# Patient Record
Sex: Female | Born: 1960 | Race: White | Hispanic: No | Smoking: Never smoker
Health system: Southern US, Community
[De-identification: ages and names within clinical notes are randomized; demographics above are authoritative.]

## PROBLEM LIST (undated history)

## (undated) DIAGNOSIS — Z923 Personal history of irradiation: Secondary | ICD-10-CM

## (undated) DIAGNOSIS — Z9221 Personal history of antineoplastic chemotherapy: Secondary | ICD-10-CM

## (undated) DIAGNOSIS — C4490 Unspecified malignant neoplasm of skin, unspecified: Secondary | ICD-10-CM

## (undated) DIAGNOSIS — C50919 Malignant neoplasm of unspecified site of unspecified female breast: Secondary | ICD-10-CM

## (undated) DIAGNOSIS — E785 Hyperlipidemia, unspecified: Secondary | ICD-10-CM

## (undated) HISTORY — PX: POLYPECTOMY: SHX149

## (undated) HISTORY — PX: BUNIONECTOMY: SHX129

## (undated) HISTORY — DX: Unspecified malignant neoplasm of skin, unspecified: C44.90

## (undated) HISTORY — DX: Hyperlipidemia, unspecified: E78.5

## (undated) HISTORY — DX: Malignant neoplasm of unspecified site of unspecified female breast: C50.919

---

## 2008-07-28 HISTORY — PX: BREAST LUMPECTOMY: SHX2

## 2012-04-13 ENCOUNTER — Telehealth: Payer: Self-pay | Admitting: *Deleted

## 2012-04-13 ENCOUNTER — Encounter: Payer: Self-pay | Admitting: Family

## 2012-04-13 ENCOUNTER — Ambulatory Visit (HOSPITAL_BASED_OUTPATIENT_CLINIC_OR_DEPARTMENT_OTHER): Payer: BC Managed Care – PPO | Admitting: Family

## 2012-04-13 ENCOUNTER — Ambulatory Visit: Payer: BC Managed Care – PPO

## 2012-04-13 ENCOUNTER — Telehealth: Payer: Self-pay | Admitting: Oncology

## 2012-04-13 ENCOUNTER — Other Ambulatory Visit (HOSPITAL_BASED_OUTPATIENT_CLINIC_OR_DEPARTMENT_OTHER): Payer: BC Managed Care – PPO

## 2012-04-13 ENCOUNTER — Other Ambulatory Visit: Payer: Self-pay | Admitting: *Deleted

## 2012-04-13 VITALS — BP 116/75 | HR 73 | Temp 98.3°F | Resp 20 | Ht 65.5 in | Wt 164.6 lb

## 2012-04-13 DIAGNOSIS — C50419 Malignant neoplasm of upper-outer quadrant of unspecified female breast: Secondary | ICD-10-CM

## 2012-04-13 DIAGNOSIS — C50919 Malignant neoplasm of unspecified site of unspecified female breast: Secondary | ICD-10-CM

## 2012-04-13 DIAGNOSIS — K7689 Other specified diseases of liver: Secondary | ICD-10-CM

## 2012-04-13 DIAGNOSIS — R911 Solitary pulmonary nodule: Secondary | ICD-10-CM

## 2012-04-13 DIAGNOSIS — Z853 Personal history of malignant neoplasm of breast: Secondary | ICD-10-CM

## 2012-04-13 DIAGNOSIS — Z170 Estrogen receptor positive status [ER+]: Secondary | ICD-10-CM

## 2012-04-13 LAB — CBC WITH DIFFERENTIAL/PLATELET
BASO%: 1.1 % (ref 0.0–2.0)
Eosinophils Absolute: 0.1 10*3/uL (ref 0.0–0.5)
LYMPH%: 32.5 % (ref 14.0–49.7)
MCHC: 33.8 g/dL (ref 31.5–36.0)
MONO#: 0.4 10*3/uL (ref 0.1–0.9)
NEUT#: 2 10*3/uL (ref 1.5–6.5)
Platelets: 239 10*3/uL (ref 145–400)
RBC: 3.85 10*6/uL (ref 3.70–5.45)
RDW: 12.9 % (ref 11.2–14.5)
WBC: 3.8 10*3/uL — ABNORMAL LOW (ref 3.9–10.3)
lymph#: 1.2 10*3/uL (ref 0.9–3.3)

## 2012-04-13 LAB — COMPREHENSIVE METABOLIC PANEL (CC13)
ALT: 59 U/L — ABNORMAL HIGH (ref 0–55)
Albumin: 4.2 g/dL (ref 3.5–5.0)
CO2: 27 mEq/L (ref 22–29)
Chloride: 104 mEq/L (ref 98–107)
Glucose: 165 mg/dl — ABNORMAL HIGH (ref 70–99)
Potassium: 3.8 mEq/L (ref 3.5–5.1)
Sodium: 140 mEq/L (ref 136–145)
Total Bilirubin: 0.5 mg/dL (ref 0.20–1.20)
Total Protein: 6.9 g/dL (ref 6.4–8.3)

## 2012-04-13 LAB — CANCER ANTIGEN 27.29: CA 27.29: 22 U/mL (ref 0–39)

## 2012-04-13 NOTE — Telephone Encounter (Signed)
Confirmed appt for today w/ pt.  Alexa Kelley & Dr. Donnie Coffin a chart.

## 2012-04-13 NOTE — Progress Notes (Signed)
[facility] NEW PATIENT EVALUATION  Name: Alexa Kelley                  DATE: 04/13/2012 MRN: [id number]                      DOB: 22-Dec-1960  CC: Cain Saupe, MD          REASON FOR VISIT: Establish care for surveillance, history left breast cancer.   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 16, 2010, tumor measuring 1.0 cm in greatest dimension, ER/PR+, HER 2 negative. Had left partial mastectomy with sentinel node biopsy Aug. 21, 2010, residual tumor measuring 0.4 cm  With no evidence of lymphovascular invasion, margins free with 1 of 3 sentinel nodes positive for micrometastasis (0.65mm), with level 1 & 2 axillary node dissection with no additional tumor in 16 nodes.   PRIOR THERAPY: Received 4 cycles of dose dense AC, then enrolled in the ECOG 5103 study, to receive paclitaxel plus bevacizamab vs placebo every 3 weeks for 12 weeks. Was unblinded after 3 cycles, was receiving placebo, and then completed with taxol only. Was placed on Tamoxifen May 2011, switched to Letrozole 10/05/10.   CURRENT THERAPY:  Letrozole 2.5 mg daily.   CANCER STAGE: Stage IIA, T1b, N9mi+ M0  PAST MEDICAL HISTORY:  Breast cancer and skin cancer.  PAST SURGICAL HISTORY:  Past Surgical History  Procedure Date  . Breast lumpectomy 2010    left breast     FAMILY HISTORY: Mother was diagnosed with and treated for breast cancer at age 83. Died from other causes without evidence of breast cancer recurrence. Mother's cousin diagnosed and treated for breast cancer at about the same age, alive and well. No other familial history of malignancies. She has 2 daughters and one son. One daughter has Alagille syndrome, a genetic disorder that affects the liver, heart, kidney and other body systems. Problems associated with the disorder generally become evident in infancy or early  childhood. The disorder is inherited in an autosomal dominant pattern, and the estimated prevalence of Alagille syndrome is 1 in every 100,000 live births.   ALLERGIES: NKDA  CURRENT MEDICATIONS:  Letrozole, fenofibrate, prilosec, Vit D and calcium.   SOCIAL HISTORY:  reports that she has never smoked. She has never used smokeless tobacco. She reports that she drinks about .6 ounces of alcohol per week. She reports that she does not use illicit drugs.  GYN HISTORY: G3P3.   REVIEW OF SYSTEMS: General: Negative for fever, chills, night sweats,  loss of appetite or weight loss.Occasional hot flash.  HEENT: Negative for headaches, sore  throat, difficulty swallowing, blurred vision or problem with hearing or  sinus congestion. Respiratory: Negative for shortness of breath, cough  or dyspnea on exertion. Cardiovascular: Negative for chest pain,  palpitations or pedal edema. GI: Negative for nausea, vomiting,  diarrhea, constipation, change in bowel habits or blood in the stool.  No jaundice. GU: Negative for painful or frequent urination, change in  color of urine, or decreased urinary stream. Integumentary: Negative  for skin rashes or other suspicious skin lesions. Hematologic: Negative  for easy bruisability or bleeding. Musculoskeletal: Negative for  complaints of pain, arthralgias, arthritis or myalgias. Mild left upper extremity Lymphedema, wears compression sleeve.  Neurological/psychiatric: Negative for numbness, focal weakness,  balance problems or coordination difficulties. No depression or anxiety.  Breast: No self-detected breast complaints.   PERFORMANCE STATUS: ECOG 0  PSYCHOSOCIAL: Married, has 2 children in college and one at home. Husband works at [employer], was just diagnosed with ALS.  PHYSICAL EXAM: General: Well developed, well nourished, in no acute distress.  EENT: No ocular or oral lesions. No stomatitis.  Respiratory: Lungs are clear to auscultation bilaterally with  normal respiratory movement and no accessory muscle use. Cardiac: No murmur, rub or tachycardia. No upper or lower extremity edema.  GI: Abdomen is soft, no palpable hepatosplenomegaly. No fluid wave. No tenderness. Musculoskeletal: No kyphosis, no tenderness over the spine, ribs or hips. Compression sleeve in place, left arm. No noticeable lymphedema.  Lymph: No cervical, infraclavicular, axillary or inguinal adenopathy. Neuro: No focal neurological deficits. Psych: Alert and oriented X 3, appropriate mood and affect.  BREAST EXAM: In the supine position, with the right arm over the head, the right nipple is everted. No periareolar edema or nipple discharge. No mass in any quadrant or subareolar region. No redness of the skin. No right axillary adenopathy. Port incision scar, right chest.  With the left arm over the head, the left nipple is everted. No periareolar edema or nipple discharge. No mass in any quadrant or subareolar region. From the 11:30 o'clock position - 1:30 position, a well healed remote lumpectomy scar. No redness of the skin. No left axillary adenopathy. Well healed incision.   LABORATORY DATA:  Lab Results  Component Value Date   WBC 3.8* 04/13/2012   HGB 11.8 04/13/2012   HCT 34.9 04/13/2012   MCV 90.5 04/13/2012   PLT 239 04/13/2012  Vitamin D pending.   RADIOGRAPHIC STUDIES: Last bone density Dec. 2012.  Mammogram is due now.   IMPRESSION:51 y/o with: 1. History left breast cancer, 2010, completed chemo in Feb. 2011. No evidence of recurrence clinically 2. On Letrozole with good tolerance.  3. Due mammogram.  4. Lymphedema, left arm. Wears sleeve with good control.  5. Vaginal dryness on antiestrogen.   6. BRCA negative, testing done in [place].  7. Pulmonary nodule and liver lesion, stable on subsequent imaging.   PLAN:    1. Mammogram 2. Remain on Letrozole. 3. Vitamin E vaginally twice weekly.  4. Return in 6 months to see Dr. Donnie Coffin per NCCN guidelines for  surveillance.   Dr Donnie Coffin met the patient and counseled her regarding surveillance plan. She knows to call the office if the need arises.

## 2012-04-13 NOTE — Telephone Encounter (Signed)
No pof sent - per pt no f/u required. Pt given appt for mammo @ [facility] 9/26.

## 2012-05-06 ENCOUNTER — Ambulatory Visit
Admission: RE | Admit: 2012-05-06 | Discharge: 2012-05-06 | Disposition: A | Discharge status: Home / Self Care | Payer: BC Managed Care – PPO | Source: Ambulatory Visit | Attending: Family | Admitting: Family

## 2012-05-06 DIAGNOSIS — Z853 Personal history of malignant neoplasm of breast: Secondary | ICD-10-CM

## 2012-06-21 ENCOUNTER — Other Ambulatory Visit: Payer: Self-pay | Admitting: *Deleted

## 2012-06-21 ENCOUNTER — Telehealth: Payer: Self-pay | Admitting: *Deleted

## 2012-06-21 DIAGNOSIS — Z853 Personal history of malignant neoplasm of breast: Secondary | ICD-10-CM

## 2012-06-21 DIAGNOSIS — C50919 Malignant neoplasm of unspecified site of unspecified female breast: Secondary | ICD-10-CM

## 2012-06-21 MED ORDER — LETROZOLE 2.5 MG PO TABS: 2.5000 mg | ORAL_TABLET | Freq: Every day | ORAL | Status: DC

## 2012-06-21 NOTE — Telephone Encounter (Signed)
Mailed out calendar to inform the patient of the new date and time in 2014 

## 2012-09-04 ENCOUNTER — Telehealth: Payer: Self-pay | Admitting: *Deleted

## 2012-09-04 NOTE — Telephone Encounter (Signed)
Left vm for pt to return call to r/s f/u appt. 

## 2012-09-06 ENCOUNTER — Telehealth: Payer: Self-pay | Admitting: *Deleted

## 2012-09-06 NOTE — Telephone Encounter (Signed)
Confirmed new appt date and time for 10/06/12 at 1000 with Warner Mccreedy, NP.

## 2012-09-11 ENCOUNTER — Encounter: Payer: Self-pay | Admitting: Oncology

## 2012-10-04 ENCOUNTER — Telehealth: Payer: Self-pay | Admitting: Oncology

## 2012-10-04 NOTE — Telephone Encounter (Signed)
Pt called and wants to r/s appt for 3/12 to April 2014 , she wants to draw labs a few days before MD she is a former Dr. Donnie Coffin pt and now Dr. Welton Flakes

## 2012-10-06 ENCOUNTER — Ambulatory Visit: Payer: BC Managed Care – PPO | Admitting: Gynecologic Oncology

## 2012-10-08 ENCOUNTER — Encounter: Payer: Self-pay | Admitting: Oncology

## 2012-10-12 ENCOUNTER — Other Ambulatory Visit: Payer: BC Managed Care – PPO | Admitting: Lab

## 2012-10-20 ENCOUNTER — Other Ambulatory Visit (HOSPITAL_BASED_OUTPATIENT_CLINIC_OR_DEPARTMENT_OTHER): Payer: BC Managed Care – PPO

## 2012-10-20 DIAGNOSIS — C50919 Malignant neoplasm of unspecified site of unspecified female breast: Secondary | ICD-10-CM

## 2012-10-20 DIAGNOSIS — Z853 Personal history of malignant neoplasm of breast: Secondary | ICD-10-CM

## 2012-10-20 LAB — LACTATE DEHYDROGENASE (CC13): LDH: 157 U/L (ref 125–245)

## 2012-10-20 LAB — COMPREHENSIVE METABOLIC PANEL (CC13)
Albumin: 4 g/dL (ref 3.5–5.0)
BUN: 20.3 mg/dL (ref 7.0–26.0)
Calcium: 9.8 mg/dL (ref 8.4–10.4)
Chloride: 107 mEq/L (ref 98–107)
Creatinine: 1 mg/dL (ref 0.6–1.1)
Glucose: 146 mg/dl — ABNORMAL HIGH (ref 70–99)
Potassium: 4.2 mEq/L (ref 3.5–5.1)

## 2012-10-20 LAB — CBC WITH DIFFERENTIAL/PLATELET
Basophils Absolute: 0.1 10*3/uL (ref 0.0–0.1)
HCT: 38.3 % (ref 34.8–46.6)
HGB: 13.1 g/dL (ref 11.6–15.9)
LYMPH%: 41.3 % (ref 14.0–49.7)
MCH: 30.8 pg (ref 25.1–34.0)
MONO#: 0.4 10*3/uL (ref 0.1–0.9)
NEUT%: 43.2 % (ref 38.4–76.8)
Platelets: 270 10*3/uL (ref 145–400)
WBC: 4.8 10*3/uL (ref 3.9–10.3)
lymph#: 2 10*3/uL (ref 0.9–3.3)

## 2012-10-21 ENCOUNTER — Ambulatory Visit: Payer: BC Managed Care – PPO | Admitting: Oncology

## 2012-10-27 ENCOUNTER — Ambulatory Visit: Payer: BC Managed Care – PPO | Admitting: Gynecologic Oncology

## 2012-10-29 ENCOUNTER — Encounter: Payer: Self-pay | Admitting: Family

## 2012-10-29 ENCOUNTER — Telehealth: Payer: Self-pay | Admitting: *Deleted

## 2012-10-29 ENCOUNTER — Ambulatory Visit (HOSPITAL_BASED_OUTPATIENT_CLINIC_OR_DEPARTMENT_OTHER): Payer: BC Managed Care – PPO | Admitting: Family

## 2012-10-29 VITALS — BP 130/77 | HR 61 | Temp 98.3°F | Resp 20 | Ht 65.0 in | Wt 163.5 lb

## 2012-10-29 DIAGNOSIS — F3289 Other specified depressive episodes: Secondary | ICD-10-CM

## 2012-10-29 DIAGNOSIS — Z853 Personal history of malignant neoplasm of breast: Secondary | ICD-10-CM

## 2012-10-29 DIAGNOSIS — F329 Major depressive disorder, single episode, unspecified: Secondary | ICD-10-CM

## 2012-10-29 DIAGNOSIS — C50919 Malignant neoplasm of unspecified site of unspecified female breast: Secondary | ICD-10-CM

## 2012-10-29 DIAGNOSIS — N959 Unspecified menopausal and perimenopausal disorder: Secondary | ICD-10-CM

## 2012-10-29 DIAGNOSIS — C50912 Malignant neoplasm of unspecified site of left female breast: Secondary | ICD-10-CM

## 2012-10-29 DIAGNOSIS — I890 Lymphedema, not elsewhere classified: Secondary | ICD-10-CM

## 2012-10-29 MED ORDER — LETROZOLE 2.5 MG PO TABS: 2.5000 mg | ORAL_TABLET | Freq: Every day | ORAL | Status: DC

## 2012-10-29 NOTE — Progress Notes (Signed)
[facility]  Telephone:([phone] Fax:([phone]) [phone]  OFFICE PROGRESS NOTE  PATIENT: Alexa Kelley   DOB: 1961-07-10  MR#: [id number]  VHQ#:[id number]   UX:LKGM, CAMMIE, MD   DIAGNOSIS:  A 52 year old [place], [place] woman with invasive ductal carcinoma of the left breast diagnosed in 02/2009.  The patient received the majority of her oncology care in [place] at [facility].   PRIOR THERAPY: 1.  Status post left breast excisional biopsy on 02/09/2009 which showed a ER positive, PR positive, HER-2/neu negative tumor measuring 1.0 cm.  2.  Status post left breast lumpectomy on 03/17/2009 with sentinel node biopsy, for a stage IIA, T1b N65mi+, invasive ductal carcinoma, residual tumor measured 0.4 cm, with no evidence of lymphovascular invasion, surgical margins were free of tumor, with 1/3 positive lymph nodes for micrometastasis (0.5 mm).  3.  Status post level I and level II left axillary lymph node dissection with no additional tumor in 16 resected lymph nodes.  4.  The patient was enrolled in ECOG 5103 study and is status post 4 cycles of chemotherapy with dose dense Doxorubicin/Cyclophosphamide (Adriamycin/Cytoxan) followed by 12 cycles of every 3 weeks Taxol/Avastin vs placebo.  The study was unblinded after 3 cycles and the patient was found to be in the placebo arm of the study that had only received Taxol.  She completed the study receiving Taxol every 3 weeks and completed therapy on 09/02/2009.  5.  The patient completed radiation therapy in 10/2009 or 11/2009.  6.  The patient underwent genetic testing and was found to be negative for the BRCA1 and BRCA2 gene mutation in 2011.  7.  The patient started antiestrogen therapy with Tamoxifen in 11/2009.  When she was found to have a postmenopausal estradiol and FSH levels, she was switched to Letrozole in 12/2010.  CURRENT THERAPY: Annual bilateral digital diagnostic mammography and antiestrogen therapy  with Letrozole 2.5 mg by mouth daily   INTERVAL HISTORY: Dr. Welton Flakes and I saw [name] today for follow up of invasive ductal carcinoma of the left breast.  She was last seen by Colman Cater on 04/13/2012.  Since her last office visit she has been experiencing left upper extremity lymphedema, urinary frequency, vaginal dryness, hot flashes and she is under a tremendous amount of personal stress with 2 ailing family members at home (her husband was recently diagnosed with ALS and she has a daughter with Alagille syndrome). The patient denies any other symptomatology.   PAST MEDICAL HISTORY: Past Medical History  Diagnosis Date  . Breast cancer   . Skin cancer   . Hyperlipidemia     PAST SURGICAL HISTORY: Past Surgical History  Procedure Laterality Date  . Breast lumpectomy  2010    left breast   . Polypectomy    . Bunionectomy Left     FAMILY HISTORY: Family History  Problem Relation Age of Onset  . Cancer Mother     Breast cancer  . Thrombosis Mother     Brain clot  . Cancer Cousin     Breast cancer  . Hypertension Father   . Cancer Brother     Basal cell carcinoma  . Other Daughter     Alagille syndrome  . ALS Other     Husband    SOCIAL HISTORY: History  Substance Use Topics  . Smoking status: Never Smoker   . Smokeless tobacco: Never Used  . Alcohol Use: 0.6 oz/week    1 Glasses of wine per week  ALLERGIES: No Known Allergies   MEDICATIONS:  Current Outpatient Prescriptions  Medication Sig Dispense Refill  . aspirin 81 MG chewable tablet Chew 81 mg by mouth daily.      . calcium carbonate (OS-CAL) 600 MG TABS Take 600 mg by mouth 2 (two) times daily with a meal.      . calcium-vitamin D 250-100 MG-UNIT per tablet Take 1 tablet by mouth 2 (two) times daily.      Marland Kitchen ezetimibe-simvastatin (VYTORIN) 10-40 MG per tablet Take 1 tablet by mouth at bedtime.      . fenofibrate (TRIGLIDE) 50 MG tablet Take 50 mg by mouth daily.      . fish oil-omega-3 fatty  acids 1000 MG capsule Take 2 g by mouth daily.      Marland Kitchen letrozole (FEMARA) 2.5 MG tablet Take 1 tablet (2.5 mg total) by mouth daily.  90 tablet  8  . Multiple Vitamins-Minerals (MULTIVITAMIN WITH MINERALS) tablet Take 1 tablet by mouth 2 (two) times a week.       No current facility-administered medications for this visit.      REVIEW OF SYSTEMS: A 10 point review of systems was completed and is negative except as noted above.    PHYSICAL EXAMINATION: BP 130/77  Pulse 61  Temp(Src) 98.3 F (36.8 C) (Oral)  Resp 20  Ht 5\' 5"  (1.651 m)  Wt 163 lb 8 oz (74.163 kg)  BMI 27.21 kg/m2   General appearance: Alert, cooperative, well nourished, mild distress Head: Normocephalic, without obvious abnormality, atraumatic Eyes: Conjunctivae/corneas clear, PERRLA, EOMI Nose: Nares, septum and mucosa are normal, no drainage or sinus tenderness Neck: No adenopathy, supple, symmetrical, trachea midline, thyroid not enlarged, no tenderness Resp: Clear to auscultation bilaterally Cardio: Regular rate and rhythm, S1, S2 normal, no murmur, click, rub or gallop Breasts: Soft bilaterally, left breast and axillary well-healed surgical scars, left axillary tenderness, left axillary fullness, left upper extremity lymphadenopathy -currently wearing a lymphedema sleeve, left breast radiation changes noted, right breast no nipple inversion, no axilla fullness GI: Soft, distended, non-tender, hypoactive bowel sounds, no organomegaly Skin: Several nevi on trunk area Extremities: Extremities normal, atraumatic, no cyanosis or edema Lymph nodes: Cervical, supraclavicular, and axillary nodes normal Neurologic: Grossly normal Psych: Visible anxiety   ECOG FS:  Grade 1 - Symptomatic but completely ambulatory        LAB RESULTS: Lab Results  Component Value Date   WBC 4.8 10/20/2012   NEUTROABS 2.1 10/20/2012   HGB 13.1 10/20/2012   HCT 38.3 10/20/2012   MCV 89.9 10/20/2012   PLT 270 10/20/2012      Chemistry       Component Value Date/Time   NA 142 10/20/2012 0859   K 4.2 10/20/2012 0859   CL 107 10/20/2012 0859   CO2 26 10/20/2012 0859   BUN 20.3 10/20/2012 0859   CREATININE 1.0 10/20/2012 0859      Component Value Date/Time   CALCIUM 9.8 10/20/2012 0859   ALKPHOS 52 10/20/2012 0859   AST 29 10/20/2012 0859   ALT 44 10/20/2012 0859   BILITOT 0.34 10/20/2012 0859      Lab Results  Component Value Date   LABCA2 20 10/20/2012     RADIOGRAPHIC STUDIES: No results found.  ASSESSMENT: 52 y.o. [place], [place] woman with: 1.Status post left breast excisional biopsy on 02/09/2009 which showed a ER positive, PR positive, HER-2/neu negative tumor measuring 1.0 cm.   Status post left breast lumpectomy on 03/17/2009 with sentinel node biopsy,  for a stage IIA, T1b N14mi+, invasive ductal carcinoma, residual tumor measured 0.4 cm, with no evidence of lymphovascular invasion, surgical margins were free of tumor, with 1/3 positive lymph nodes for micrometastasis (0.5 mm).  Status post chemotherapy as part of the  ECOG 5103 study which included 4 cycles of chemotherapy with dose dense Doxorubicin/cyclophosphamide (Adriamycin/Cytoxan) followed by 12 cycles of every 3 weeks Taxol.  Status post radiation therapy completed in 10/2009 The patient underwent genetic testing and was found to be negative for the BRCA1 and BRCA2 gene mutation in 2011.  The patient started antiestrogen therapy with Tamoxifen in 11/2009.  When she was found to have a postmenopausal estradiol and FSH levels, she was switched to Letrozole in 12/2010.  2.  Anxiety/depression  3.  Left upper extremity lymphedema  4.  Vaginal dryness and hot flashes  PLAN: 1.  The patient will continue antiestrogen therapy with Letrozole 2.5 mg by mouth daily.  An electronic prescription for letrozole was sent to the patient's pharmacy [id number] with 8 refills.  Dr. Welton Flakes would like the patient to stay on antiestrogen therapy until 12/2015.  The patient will  be due for another bilateral digital diagnostic mammogram in 04/2013.  We have scheduled her annual mammogram for her.  2.  Dr. Welton Flakes suggested that the patient accept a referral to Psycho-oncologist Dr. Merry Proud, PsyD. to discuss her on going stressors and anxiety/depression.   The patient declined the referral.  The patient was asked to speak to her primary care physician about her request for Ativan.  3.  The patient will be referred to the lymphedema clinic for lymphedema physical therapy.  The patient has previously received lymphedema therapy when she was at [facility] in [place].  4. The patient was also encouraged to exercise daily, keep well-hydrated, get plenty of rest and eat a healthy diet.    5.  We plan to see the patient again in 6 months at which time we will check a CBC, CMP, vitamin D level, and LDH.  All questions were answered.  The patient was encouraged to contact us in the interim with any problems, questions or concerns.    Larina Bras, NP-C 10/31/2012, 6:16 PM

## 2012-10-29 NOTE — Patient Instructions (Addendum)
Please contact us at [phone] if you have any questions or concerns.  Exercise daily, drink plenty of water daily, eat a healthy diet  Consider speaking to Dr. Noe Gens

## 2012-10-29 NOTE — Telephone Encounter (Signed)
appts made and printed 

## 2012-10-31 ENCOUNTER — Encounter: Payer: Self-pay | Admitting: Family

## 2012-11-18 ENCOUNTER — Ambulatory Visit: Payer: BC Managed Care – PPO | Admitting: Physical Therapy

## 2012-12-25 ENCOUNTER — Encounter: Payer: Self-pay | Admitting: *Deleted

## 2012-12-25 NOTE — Progress Notes (Signed)
Bjorn Loser at [facility] faxed a request for records.  Pt's medical release has expired.  Mailed a new form to pt to sign and fax back w/ a return envelope.

## 2013-01-14 ENCOUNTER — Telehealth: Payer: Self-pay

## 2013-01-14 NOTE — Telephone Encounter (Signed)
LMOVM regarding pts request for an appt due to having bone pain. Appts scheduled for lab and ov on Monday. TMB

## 2013-01-14 NOTE — Telephone Encounter (Signed)
Spoke with pt regarding her appt for Monday. Pt voiced understanding and knows to call the office with any further questions. TMB

## 2013-01-17 ENCOUNTER — Encounter: Payer: Self-pay | Admitting: Adult Health

## 2013-01-17 ENCOUNTER — Other Ambulatory Visit (HOSPITAL_BASED_OUTPATIENT_CLINIC_OR_DEPARTMENT_OTHER): Payer: BC Managed Care – PPO | Admitting: Lab

## 2013-01-17 ENCOUNTER — Ambulatory Visit (HOSPITAL_BASED_OUTPATIENT_CLINIC_OR_DEPARTMENT_OTHER): Payer: BC Managed Care – PPO | Admitting: Adult Health

## 2013-01-17 VITALS — BP 165/101 | HR 74 | Temp 98.3°F | Resp 20 | Ht 65.0 in | Wt 161.4 lb

## 2013-01-17 DIAGNOSIS — C50912 Malignant neoplasm of unspecified site of left female breast: Secondary | ICD-10-CM

## 2013-01-17 DIAGNOSIS — C50919 Malignant neoplasm of unspecified site of unspecified female breast: Secondary | ICD-10-CM

## 2013-01-17 DIAGNOSIS — Z853 Personal history of malignant neoplasm of breast: Secondary | ICD-10-CM

## 2013-01-17 LAB — CBC WITH DIFFERENTIAL/PLATELET
Basophils Absolute: 0.1 10*3/uL (ref 0.0–0.1)
Eosinophils Absolute: 0.1 10*3/uL (ref 0.0–0.5)
HCT: 38.7 % (ref 34.8–46.6)
LYMPH%: 35.9 % (ref 14.0–49.7)
MCV: 89.1 fL (ref 79.5–101.0)
MONO#: 0.5 10*3/uL (ref 0.1–0.9)
MONO%: 8.2 % (ref 0.0–14.0)
NEUT#: 3 10*3/uL (ref 1.5–6.5)
NEUT%: 52.4 % (ref 38.4–76.8)
Platelets: 266 10*3/uL (ref 145–400)
WBC: 5.6 10*3/uL (ref 3.9–10.3)

## 2013-01-17 LAB — COMPREHENSIVE METABOLIC PANEL (CC13)
Alkaline Phosphatase: 62 U/L (ref 40–150)
CO2: 24 mEq/L (ref 22–29)
Creatinine: 0.9 mg/dL (ref 0.6–1.1)
Glucose: 108 mg/dl — ABNORMAL HIGH (ref 70–99)
Sodium: 140 mEq/L (ref 136–145)
Total Bilirubin: 0.71 mg/dL (ref 0.20–1.20)
Total Protein: 8 g/dL (ref 6.4–8.3)

## 2013-01-17 NOTE — Patient Instructions (Signed)
Take tylenol or advil for the pain. Call us if it doesn't go away in a few weeks.  Please refrain from touching it.    Please call us if you have any questions or concerns.

## 2013-01-17 NOTE — Progress Notes (Signed)
[facility]  Telephone:(336) [phone] Fax:(336) [phone]  OFFICE PROGRESS NOTE  PATIENT: Alexa Kelley   DOB: [birth year]  MR#: [id number]  JYN#:[id number]   QM:VHQI, CAMMIE, MD   DIAGNOSIS:  A 52 year old [place], [place] woman with invasive ductal carcinoma of the left breast diagnosed in 02/2009.  The patient received the majority of her oncology care in [place] at [facility].   PRIOR THERAPY: 1.  Status post left breast excisional biopsy on 02/09/2009 which showed a ER positive, PR positive, HER-2/neu negative tumor measuring 1.0 cm.  2.  Status post left breast lumpectomy on 03/17/2009 with sentinel node biopsy, for a stage IIA, T1b N75mi+, invasive ductal carcinoma, residual tumor measured 0.4 cm, with no evidence of lymphovascular invasion, surgical margins were free of tumor, with 1/3 positive lymph nodes for micrometastasis (0.5 mm).  3.  Status post level I and level II left axillary lymph node dissection with no additional tumor in 16 resected lymph nodes.  4.  The patient was enrolled in ECOG 5103 study and is status post 4 cycles of chemotherapy with dose dense Doxorubicin/Cyclophosphamide (Adriamycin/Cytoxan) followed by 12 cycles of every 3 weeks Taxol/Avastin vs placebo.  The study was unblinded after 3 cycles and the patient was found to be in the placebo arm of the study that had only received Taxol.  She completed the study receiving Taxol every 3 weeks and completed therapy on 09/02/2009.  5.  The patient completed radiation therapy in 10/2009 or 11/2009.  6.  The patient underwent genetic testing and was found to be negative for the BRCA1 and BRCA2 gene mutation in 2011.  7.  The patient started antiestrogen therapy with Tamoxifen in 11/2009.  When she was found to have a postmenopausal estradiol and FSH levels, she was switched to Letrozole in 12/2010.  CURRENT THERAPY: Annual bilateral digital diagnostic mammography and antiestrogen therapy  with Letrozole 2.5 mg by mouth daily   INTERVAL HISTORY: [name] is here today for an interim visit due to pain.  The pain is located in the ribs directly underneath the breast.  This pain started 1.5 weeks ago.  She was moving boxes in [place] when she first noticed it.  The pain is intermittent, and only occurs when the patient presses on it.  When the patient presses on her rib the pain is a 2/10.  She has not tried tylenol for the pain.  Aggravating factors: touching the actual rib, she has no pain when she doesn't touch it.  She denies fatigue, night sweats, unintentional weight loss, or any other concerns.  She does endorse that she is very anxious, and the anxiety gives her hot flashes.  She made the comment that her husband has ALS and she has to stay alive and is terrified of recurrence.     PAST MEDICAL HISTORY: Past Medical History  Diagnosis Date  . Breast cancer   . Skin cancer   . Hyperlipidemia     PAST SURGICAL HISTORY: Past Surgical History  Procedure Laterality Date  . Breast lumpectomy  2010    left breast   . Polypectomy    . Bunionectomy Left     FAMILY HISTORY: Family History  Problem Relation Age of Onset  . Cancer Mother     Breast cancer  . Thrombosis Mother     Brain clot  . Cancer Cousin     Breast cancer  . Hypertension Father   . Cancer Brother     Basal cell carcinoma  .  Other Daughter     Alagille syndrome  . ALS Other     Husband    SOCIAL HISTORY: History  Substance Use Topics  . Smoking status: Never Smoker   . Smokeless tobacco: Never Used  . Alcohol Use: 0.6 oz/week    1 Glasses of wine per week    ALLERGIES: No Known Allergies   MEDICATIONS:  Current Outpatient Prescriptions  Medication Sig Dispense Refill  . aspirin 81 MG chewable tablet Chew 81 mg by mouth daily.      . calcium-vitamin D 250-100 MG-UNIT per tablet Take 1 tablet by mouth 2 (two) times daily.      . clarithromycin (BIAXIN) 500 MG tablet       .  fish oil-omega-3 fatty acids 1000 MG capsule Take 2 g by mouth daily.      Marland Kitchen letrozole (FEMARA) 2.5 MG tablet Take 1 tablet (2.5 mg total) by mouth daily.  90 tablet  8  . LORazepam (ATIVAN) 1 MG tablet       . Multiple Vitamins-Minerals (MULTIVITAMIN WITH MINERALS) tablet Take 1 tablet by mouth 2 (two) times a week.      Marland Kitchen OMEPRAZOLE PO Take 1 tablet by mouth daily.      . simvastatin (ZOCOR) 40 MG tablet       . calcium carbonate (OS-CAL) 600 MG TABS Take 600 mg by mouth 2 (two) times daily with a meal.       No current facility-administered medications for this visit.      REVIEW OF SYSTEMS: A 10 point review of systems was completed and is negative except as noted above.    PHYSICAL EXAMINATION: BP 165/101  Pulse 74  Temp(Src) 98.3 F (36.8 C) (Oral)  Resp 20  Ht 5\' 5"  (1.651 m)  Wt 161 lb 6.4 oz (73.211 kg)  BMI 26.86 kg/m2  General appearance: Alert, cooperative, well nourished, mild distress Head: Normocephalic, without obvious abnormality, atraumatic Eyes: Conjunctivae/corneas clear, PERRLA, EOMI Nose: Nares, septum and mucosa are normal, no drainage or sinus tenderness Neck: No adenopathy, supple, symmetrical, trachea midline, thyroid not enlarged, no tenderness Resp: Clear to auscultation bilaterally Cardio: Regular rate and rhythm, S1, S2 normal, no murmur, click, rub or gallop Breasts: Soft bilaterally, left breast and axillary well-healed surgical scars, left axillary tenderness, left axillary fullness, left upper extremity lymphadenopathy, right breast no nipple inversion, no axilla fullness, slight tenderness in 6-7th ICS, no pain on the rib GI: Soft, distended, non-tender, hypoactive bowel sounds, no organomegaly Skin: Several nevi on trunk area Extremities: Extremities normal, atraumatic, no cyanosis or edema Lymph nodes: Cervical, supraclavicular, and axillary nodes normal Neurologic: Grossly normal Psych: Visible anxiety   ECOG FS:  Grade 1 - Symptomatic  but completely ambulatory        LAB RESULTS: Lab Results  Component Value Date   WBC 5.6 01/17/2013   NEUTROABS 3.0 01/17/2013   HGB 13.2 01/17/2013   HCT 38.7 01/17/2013   MCV 89.1 01/17/2013   PLT 266 01/17/2013      Chemistry      Component Value Date/Time   NA 142 10/20/2012 0859   K 4.2 10/20/2012 0859   CL 107 10/20/2012 0859   CO2 26 10/20/2012 0859   BUN 20.3 10/20/2012 0859   CREATININE 1.0 10/20/2012 0859      Component Value Date/Time   CALCIUM 9.8 10/20/2012 0859   ALKPHOS 52 10/20/2012 0859   AST 29 10/20/2012 0859   ALT 44 10/20/2012 0859   BILITOT  0.34 10/20/2012 0859      Lab Results  Component Value Date   LABCA2 20 10/20/2012     RADIOGRAPHIC STUDIES: No results found.  ASSESSMENT: 52 y.o. [place], [place] woman with: 1.Status post left breast excisional biopsy on 02/09/2009 which showed a ER positive, PR positive, HER-2/neu negative tumor measuring 1.0 cm.   Status post left breast lumpectomy on 03/17/2009 with sentinel node biopsy, for a stage IIA, T1b N18mi+, invasive ductal carcinoma, residual tumor measured 0.4 cm, with no evidence of lymphovascular invasion, surgical margins were free of tumor, with 1/3 positive lymph nodes for micrometastasis (0.5 mm).  Status post chemotherapy as part of the  ECOG 5103 study which included 4 cycles of chemotherapy with dose dense Doxorubicin/cyclophosphamide (Adriamycin/Cytoxan) followed by 12 cycles of every 3 weeks Taxol.  Status post radiation therapy completed in 10/2009 The patient underwent genetic testing and was found to be negative for the BRCA1 and BRCA2 gene mutation in 2011.  The patient started antiestrogen therapy with Tamoxifen in 11/2009.  When she was found to have a postmenopausal estradiol and FSH levels, she was switched to Letrozole in 12/2010.  2.  Anxiety/depression  3. Left chest wall pain  PLAN: 1.  The patient will continue antiestrogen therapy with Letrozole 2.5 mg by mouth daily.    2.In regards to the pain, I think she may have pulled a muscle or have costochondritis from moving since that is what she was doing when all of that started.  I recommended she take Tylenol or advil, not to touch the area (since that is the only thing that elicits the pain), and call us if the pain worsens, or if it doesn't go away in a few weeks.  Then we will scan.    All questions were answered.  The patient was encouraged to contact us in the interim with any problems, questions or concerns.   I spent 25 minutes counseling the patient face to face.  The total time spent in the appointment was 30 minutes.  Cherie Ouch Lyn Hollingshead, NP Medical Oncology [facility] Phone: [phone] 01/17/2013, 1:41 PM

## 2013-02-04 ENCOUNTER — Ambulatory Visit: Payer: BC Managed Care – PPO | Admitting: Physical Therapy

## 2013-04-26 ENCOUNTER — Encounter: Payer: Self-pay | Admitting: Oncology

## 2013-05-10 ENCOUNTER — Ambulatory Visit
Admission: RE | Admit: 2013-05-10 | Discharge: 2013-05-10 | Disposition: A | Discharge status: Home / Self Care | Payer: BC Managed Care – PPO | Source: Ambulatory Visit | Attending: Family | Admitting: Family

## 2013-05-10 ENCOUNTER — Telehealth: Payer: Self-pay | Admitting: *Deleted

## 2013-05-10 DIAGNOSIS — C50912 Malignant neoplasm of unspecified site of left female breast: Secondary | ICD-10-CM

## 2013-05-10 DIAGNOSIS — Z853 Personal history of malignant neoplasm of breast: Secondary | ICD-10-CM

## 2013-05-10 NOTE — Telephone Encounter (Signed)
Called pt unable to reach lmovm Mammo looks good to call if she has concerns.

## 2013-05-12 ENCOUNTER — Other Ambulatory Visit: Payer: Self-pay | Admitting: Adult Health

## 2013-05-12 DIAGNOSIS — E559 Vitamin D deficiency, unspecified: Secondary | ICD-10-CM

## 2013-05-12 DIAGNOSIS — Z853 Personal history of malignant neoplasm of breast: Secondary | ICD-10-CM

## 2013-05-13 ENCOUNTER — Other Ambulatory Visit (HOSPITAL_BASED_OUTPATIENT_CLINIC_OR_DEPARTMENT_OTHER): Payer: BC Managed Care – PPO | Admitting: Lab

## 2013-05-13 ENCOUNTER — Ambulatory Visit (HOSPITAL_BASED_OUTPATIENT_CLINIC_OR_DEPARTMENT_OTHER): Payer: BC Managed Care – PPO | Admitting: Oncology

## 2013-05-13 ENCOUNTER — Telehealth: Payer: Self-pay | Admitting: *Deleted

## 2013-05-13 ENCOUNTER — Encounter: Payer: Self-pay | Admitting: Oncology

## 2013-05-13 VITALS — BP 158/91 | HR 76 | Temp 98.1°F | Resp 20 | Ht 65.0 in | Wt 155.1 lb

## 2013-05-13 DIAGNOSIS — C50919 Malignant neoplasm of unspecified site of unspecified female breast: Secondary | ICD-10-CM

## 2013-05-13 DIAGNOSIS — E559 Vitamin D deficiency, unspecified: Secondary | ICD-10-CM

## 2013-05-13 DIAGNOSIS — Z853 Personal history of malignant neoplasm of breast: Secondary | ICD-10-CM

## 2013-05-13 DIAGNOSIS — M940 Chondrocostal junction syndrome [Tietze]: Secondary | ICD-10-CM

## 2013-05-13 DIAGNOSIS — F341 Dysthymic disorder: Secondary | ICD-10-CM

## 2013-05-13 LAB — CBC WITH DIFFERENTIAL/PLATELET
BASO%: 0.9 % (ref 0.0–2.0)
Basophils Absolute: 0 10*3/uL (ref 0.0–0.1)
EOS%: 3.8 % (ref 0.0–7.0)
Eosinophils Absolute: 0.2 10*3/uL (ref 0.0–0.5)
HGB: 13.2 g/dL (ref 11.6–15.9)
MCHC: 34 g/dL (ref 31.5–36.0)
MCV: 89.1 fL (ref 79.5–101.0)
MONO#: 0.5 10*3/uL (ref 0.1–0.9)
MONO%: 8.2 % (ref 0.0–14.0)
NEUT#: 3.4 10*3/uL (ref 1.5–6.5)
RDW: 12.5 % (ref 11.2–14.5)
WBC: 5.7 10*3/uL (ref 3.9–10.3)
lymph#: 1.6 10*3/uL (ref 0.9–3.3)

## 2013-05-13 LAB — COMPREHENSIVE METABOLIC PANEL (CC13)
AST: 23 U/L (ref 5–34)
Albumin: 4.1 g/dL (ref 3.5–5.0)
BUN: 14.9 mg/dL (ref 7.0–26.0)
Calcium: 9.9 mg/dL (ref 8.4–10.4)
Chloride: 105 mEq/L (ref 98–109)
Glucose: 127 mg/dl (ref 70–140)
Potassium: 4 mEq/L (ref 3.5–5.1)
Sodium: 140 mEq/L (ref 136–145)
Total Protein: 7.5 g/dL (ref 6.4–8.3)

## 2013-05-13 MED ORDER — VENLAFAXINE HCL ER 37.5 MG PO CP24: 37.5000 mg | ORAL_CAPSULE | Freq: Every day | ORAL | Status: DC

## 2013-05-13 NOTE — Telephone Encounter (Signed)
appts made and printed...td 

## 2013-05-13 NOTE — Progress Notes (Signed)
[facility]  Telephone:(336) [phone] Fax:(336) [phone]  OFFICE PROGRESS NOTE  PATIENT: Alexa Kelley   DOB: 1961/03/24  MR#: [id number]  ZHY#:[id number]   EX:BMWU, CAMMIE, MD   DIAGNOSIS:  A 52 year old [place], [place] woman with invasive ductal carcinoma of the left breast diagnosed in 02/2009.  The patient received the majority of her oncology care in [place] at [facility].   PRIOR THERAPY: 1.  Status post left breast excisional biopsy on 02/09/2009 which showed a ER positive, PR positive, HER-2/neu negative tumor measuring 1.0 cm.  2.  Status post left breast lumpectomy on 03/17/2009 with sentinel node biopsy, for a stage IIA, T1b N77mi+, invasive ductal carcinoma, residual tumor measured 0.4 cm, with no evidence of lymphovascular invasion, surgical margins were free of tumor, with 1/3 positive lymph nodes for micrometastasis (0.5 mm).  3.  Status post level I and level II left axillary lymph node dissection with no additional tumor in 16 resected lymph nodes.  4.  The patient was enrolled in ECOG 5103 study and is status post 4 cycles of chemotherapy with dose dense Doxorubicin/Cyclophosphamide (Adriamycin/Cytoxan) followed by 12 cycles of every 3 weeks Taxol/Avastin vs placebo.  The study was unblinded after 3 cycles and the patient was found to be in the placebo arm of the study that had only received Taxol.  She completed the study receiving Taxol every 3 weeks and completed therapy on 09/02/2009.  5.  The patient completed radiation therapy in 10/2009 or 11/2009.  6.  The patient underwent genetic testing and was found to be negative for the BRCA1 and BRCA2 gene mutation in 2011.  7.  The patient started antiestrogen therapy with Tamoxifen in 11/2009.  When she was found to have a postmenopausal estradiol and FSH levels, she was switched to Letrozole in 12/2010.  CURRENT THERAPY:  Letrozole 2.5 mg by mouth daily   INTERVAL HISTORY: Patient is seen  in followup today. Overall she's doing well. She still continues to have some pain which is I do believe costochondritis. Symptomatic management is discussed with her. She's tolerating the letrozole quite nicely except for hot flashes. She occasionally does get aches and pains. She otherwise denies any fevers chills night sweats headaches shortness of breath chest pains palpitations she has no myalgias or arthralgias. She does have occasional left-sided costochondritis. Remainder of the 10 point review of systems is negative.  PAST MEDICAL HISTORY: Past Medical History  Diagnosis Date  . Breast cancer   . Skin cancer   . Hyperlipidemia     PAST SURGICAL HISTORY: Past Surgical History  Procedure Laterality Date  . Breast lumpectomy  2010    left breast   . Polypectomy    . Bunionectomy Left     FAMILY HISTORY: Family History  Problem Relation Age of Onset  . Cancer Mother     Breast cancer  . Thrombosis Mother     Brain clot  . Cancer Cousin     Breast cancer  . Hypertension Father   . Cancer Brother     Basal cell carcinoma  . Other Daughter     Alagille syndrome  . ALS Other     Husband    SOCIAL HISTORY: History  Substance Use Topics  . Smoking status: Never Smoker   . Smokeless tobacco: Never Used  . Alcohol Use: 0.6 oz/week    1 Glasses of wine per week    ALLERGIES: No Known Allergies   MEDICATIONS:  Current Outpatient Prescriptions  Medication  Sig Dispense Refill  . ANUCORT-HC 25 MG suppository       . aspirin 81 MG chewable tablet Chew 81 mg by mouth daily.      . chlorpheniramine-HYDROcodone (TUSSIONEX) 10-8 MG/5ML LQCR       . fish oil-omega-3 fatty acids 1000 MG capsule Take 2 g by mouth daily.      Marland Kitchen letrozole (FEMARA) 2.5 MG tablet Take 1 tablet (2.5 mg total) by mouth daily.  90 tablet  8  . LORazepam (ATIVAN) 1 MG tablet       . Multiple Vitamins-Minerals (MULTIVITAMIN WITH MINERALS) tablet Take 1 tablet by mouth 2 (two) times a week.      .  simvastatin (ZOCOR) 40 MG tablet        No current facility-administered medications for this visit.      REVIEW OF SYSTEMS: A 10 point review of systems was completed and is negative except as noted above.    PHYSICAL EXAMINATION: BP 158/91  Pulse 76  Temp(Src) 98.1 F (36.7 C) (Oral)  Resp 20  Ht 5\' 5"  (1.651 m)  Wt 155 lb 1.6 oz (70.353 kg)  BMI 25.81 kg/m2  General appearance: Alert, cooperative, well nourished, mild distress Head: Normocephalic, without obvious abnormality, atraumatic Eyes: Conjunctivae/corneas clear, PERRLA, EOMI Nose: Nares, septum and mucosa are normal, no drainage or sinus tenderness Neck: No adenopathy, supple, symmetrical, trachea midline, thyroid not enlarged, no tenderness Resp: Clear to auscultation bilaterally Cardio: Regular rate and rhythm, S1, S2 normal, no murmur, click, rub or gallop Breasts: Soft bilaterally, left breast and axillary well-healed surgical scars, left axillary tenderness, left axillary fullness, left upper extremity lymphadenopathy, right breast no nipple inversion, no axilla fullness, slight tenderness in 6-7th ICS, no pain on the rib GI: Soft, distended, non-tender, hypoactive bowel sounds, no organomegaly Skin: Several nevi on trunk area Extremities: Extremities normal, atraumatic, no cyanosis or edema Lymph nodes: Cervical, supraclavicular, and axillary nodes normal Neurologic: Grossly normal Psych: Visible anxiety   ECOG FS:  Grade 1 - Symptomatic but completely ambulatory        LAB RESULTS: Lab Results  Component Value Date   WBC 5.7 05/13/2013   NEUTROABS 3.4 05/13/2013   HGB 13.2 05/13/2013   HCT 38.9 05/13/2013   MCV 89.1 05/13/2013   PLT 231 05/13/2013      Chemistry      Component Value Date/Time   NA 140 01/17/2013 1315   K 3.8 01/17/2013 1315   CL 102 01/17/2013 1315   CO2 24 01/17/2013 1315   BUN 17.0 01/17/2013 1315   CREATININE 0.9 01/17/2013 1315      Component Value Date/Time   CALCIUM 10.2  01/17/2013 1315   ALKPHOS 62 01/17/2013 1315   AST 29 01/17/2013 1315   ALT 45 01/17/2013 1315   BILITOT 0.71 01/17/2013 1315      Lab Results  Component Value Date   LABCA2 20 10/20/2012     RADIOGRAPHIC STUDIES: No results found.  ASSESSMENT: 52 y.o. [place], [place] woman with: 1.Status post left breast excisional biopsy on 02/09/2009 which showed a ER positive, PR positive, HER-2/neu negative tumor measuring 1.0 cm.   Status post left breast lumpectomy on 03/17/2009 with sentinel node biopsy, for a stage IIA, T1b N17mi+, invasive ductal carcinoma, residual tumor measured 0.4 cm, with no evidence of lymphovascular invasion, surgical margins were free of tumor, with 1/3 positive lymph nodes for micrometastasis (0.5 mm).  Status post chemotherapy as part of the  ECOG 5103 study which  included 4 cycles of chemotherapy with dose dense Doxorubicin/cyclophosphamide (Adriamycin/Cytoxan) followed by 12 cycles of every 3 weeks Taxol.  Status post radiation therapy completed in 10/2009 The patient underwent genetic testing and was found to be negative for the BRCA1 and BRCA2 gene mutation in 2011.  The patient started antiestrogen therapy with Tamoxifen in 11/2009.  When she was found to have a postmenopausal estradiol and FSH levels, she was switched to Letrozole in 12/2010.  2.  Anxiety/depression  3. Left chest wall pain  PLAN: 1. continue antiestrogen therapy with Letrozole 2.5 mg by mouth daily.   2. Costochondritis continue symptomatic management  All questions were answered.  The patient was encouraged to contact us in the interim with any problems, questions or concerns.   I spent 15 minutes counseling the patient face to face.  The total time spent in the appointment was 20 minutes.  Drue Second, MD Medical/Oncology [facility] [phone] (beeper) [phone] (Office)  05/13/2013, 9:51 AM

## 2013-05-13 NOTE — Patient Instructions (Signed)
Continue the letrozole 2.5 mg daily  Begin effexor xr daily at bedtime  Take vitamin D3 1000 iu daily  We will see you back in 6 months

## 2013-06-02 ENCOUNTER — Other Ambulatory Visit: Payer: Self-pay | Admitting: Family Medicine

## 2013-06-02 ENCOUNTER — Other Ambulatory Visit: Payer: Self-pay

## 2013-06-02 ENCOUNTER — Ambulatory Visit
Admission: RE | Admit: 2013-06-02 | Discharge: 2013-06-02 | Disposition: A | Discharge status: Home / Self Care | Payer: BC Managed Care – PPO | Source: Ambulatory Visit | Attending: Family Medicine | Admitting: Family Medicine

## 2013-06-02 DIAGNOSIS — M79605 Pain in left leg: Secondary | ICD-10-CM

## 2013-08-02 ENCOUNTER — Encounter: Payer: Self-pay | Admitting: *Deleted

## 2013-08-02 NOTE — Progress Notes (Signed)
Received request from [place] at [facility] for f/u notes and labs for this pt.  Faxed notes and labs to [phone].

## 2013-11-11 ENCOUNTER — Telehealth: Payer: Self-pay | Admitting: Adult Health

## 2013-11-11 ENCOUNTER — Encounter: Payer: Self-pay | Admitting: Adult Health

## 2013-11-11 ENCOUNTER — Other Ambulatory Visit (HOSPITAL_BASED_OUTPATIENT_CLINIC_OR_DEPARTMENT_OTHER): Payer: BC Managed Care – PPO

## 2013-11-11 ENCOUNTER — Ambulatory Visit (HOSPITAL_BASED_OUTPATIENT_CLINIC_OR_DEPARTMENT_OTHER): Payer: BC Managed Care – PPO | Admitting: Adult Health

## 2013-11-11 VITALS — BP 163/89 | HR 62 | Temp 98.1°F | Resp 18 | Ht 65.0 in | Wt 153.3 lb

## 2013-11-11 DIAGNOSIS — Z170 Estrogen receptor positive status [ER+]: Secondary | ICD-10-CM

## 2013-11-11 DIAGNOSIS — Z853 Personal history of malignant neoplasm of breast: Secondary | ICD-10-CM

## 2013-11-11 DIAGNOSIS — E2839 Other primary ovarian failure: Secondary | ICD-10-CM

## 2013-11-11 DIAGNOSIS — I890 Lymphedema, not elsewhere classified: Secondary | ICD-10-CM

## 2013-11-11 DIAGNOSIS — C50919 Malignant neoplasm of unspecified site of unspecified female breast: Secondary | ICD-10-CM

## 2013-11-11 DIAGNOSIS — E559 Vitamin D deficiency, unspecified: Secondary | ICD-10-CM

## 2013-11-11 DIAGNOSIS — C50912 Malignant neoplasm of unspecified site of left female breast: Secondary | ICD-10-CM

## 2013-11-11 DIAGNOSIS — F419 Anxiety disorder, unspecified: Secondary | ICD-10-CM

## 2013-11-11 LAB — COMPREHENSIVE METABOLIC PANEL (CC13)
ALT: 44 U/L (ref 0–55)
AST: 31 U/L (ref 5–34)
Albumin: 4.4 g/dL (ref 3.5–5.0)
Alkaline Phosphatase: 78 U/L (ref 40–150)
Anion Gap: 13 mEq/L — ABNORMAL HIGH (ref 3–11)
BILIRUBIN TOTAL: 0.59 mg/dL (ref 0.20–1.20)
BUN: 14.5 mg/dL (ref 7.0–26.0)
CALCIUM: 10.1 mg/dL (ref 8.4–10.4)
CHLORIDE: 103 meq/L (ref 98–109)
CO2: 26 mEq/L (ref 22–29)
Creatinine: 0.8 mg/dL (ref 0.6–1.1)
Glucose: 97 mg/dl (ref 70–140)
Potassium: 4 mEq/L (ref 3.5–5.1)
Sodium: 143 mEq/L (ref 136–145)
Total Protein: 7.5 g/dL (ref 6.4–8.3)

## 2013-11-11 LAB — CBC WITH DIFFERENTIAL/PLATELET
BASO%: 0.9 % (ref 0.0–2.0)
Basophils Absolute: 0 10*3/uL (ref 0.0–0.1)
EOS%: 4.7 % (ref 0.0–7.0)
Eosinophils Absolute: 0.2 10*3/uL (ref 0.0–0.5)
HCT: 39.8 % (ref 34.8–46.6)
HGB: 13 g/dL (ref 11.6–15.9)
LYMPH#: 1.6 10*3/uL (ref 0.9–3.3)
LYMPH%: 34.5 % (ref 14.0–49.7)
MCH: 30 pg (ref 25.1–34.0)
MCHC: 32.7 g/dL (ref 31.5–36.0)
MCV: 91.9 fL (ref 79.5–101.0)
MONO#: 0.4 10*3/uL (ref 0.1–0.9)
MONO%: 9.2 % (ref 0.0–14.0)
NEUT#: 2.4 10*3/uL (ref 1.5–6.5)
NEUT%: 50.7 % (ref 38.4–76.8)
Platelets: 253 10*3/uL (ref 145–400)
RBC: 4.33 10*6/uL (ref 3.70–5.45)
RDW: 12.7 % (ref 11.2–14.5)
WBC: 4.7 10*3/uL (ref 3.9–10.3)

## 2013-11-11 MED ORDER — VENLAFAXINE HCL ER 37.5 MG PO CP24: 37.5000 mg | ORAL_CAPSULE | Freq: Every day | ORAL | Status: AC

## 2013-11-11 MED ORDER — LETROZOLE 2.5 MG PO TABS: 2.5000 mg | ORAL_TABLET | Freq: Every day | ORAL | Status: DC

## 2013-11-11 NOTE — Progress Notes (Signed)
Hematology and Oncology Follow Up Visit  Alexa Kelley [id number] [birth year] 53 y.o. 11/13/2013 8:03 AM     Principle Diagnosis:Alexa Kelley 53 y.o. 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     Prior Therapy:  1. Status post left breast excisional biopsy on 02/09/2009 which showed a ER positive, PR positive, HER-2/neu negative tumor measuring 1.0 cm.   2. Status post left breast lumpectomy on 03/17/2009 with sentinel node biopsy, for a stage IIA, T1b N7m+, invasive ductal carcinoma, residual tumor measured 0.4 cm, with no evidence of lymphovascular invasion, surgical margins were free of tumor, with 1/3 positive lymph nodes for micrometastasis (0.5 mm).   3. Status post level I and level II left axillary lymph node dissection with no additional tumor in 16 resected lymph nodes.   4. The patient was enrolled in ECOG 5103 study and is status post 4 cycles of chemotherapy with dose dense Doxorubicin/Cyclophosphamide (Adriamycin/Cytoxan) followed by 12 cycles of every 3 weeks Taxol/Avastin vs placebo. The study was unblinded after 3 cycles and the patient was found to be in the placebo arm of the study that had only received Taxol. She completed the study receiving Taxol every 3 weeks and completed therapy on 09/02/2009.   5. The patient completed radiation therapy in 10/2009 or 11/2009.   6. The patient underwent genetic testing and was found to be negative for the BRCA1 and BRCA2 gene mutation in 2011.   7. The patient started antiestrogen therapy with Tamoxifen in 11/2009. When she was found to have a postmenopausal estradiol and FSH levels, she was switched to Letrozole in 12/2010.   Current therapy:  Letrozole 2.525mdaily  Interim History: Alexa [place]ough[age][place]89.o. 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  She is taking Letrozole daily.  She is c/o thinning hair, elbow  pain alleviated by essential oil, hot flashes that are much less frequent, vaginal dryness managed with PRN lubrication.  None of these are bothering her.  She has an area under her left arm that she wants me to look at that she feels is bulging out more than usual.  She cannot remember how long its been going on. She has ongoing lymphedema in her left arm and she does wear her sleeve.  Otherwise she is doing well and a 10 point ROS is negative.  I reviewed her health maintenance with her below.    Medications:  Current Outpatient Prescriptions  Medication Sig Dispense Refill  . aspirin 81 MG chewable tablet Chew 81 mg by mouth daily.      . cholecalciferol (VITAMIN D) 1000 UNITS tablet Take 1,000 Units by mouth 2 (two) times daily.      . COCONUT OIL PO Take 1 capsule by mouth every morning.      . fish oil-omega-3 fatty acids 1000 MG capsule Take 2 g by mouth daily.      . Marland Kitchenetrozole (FEMARA) 2.5 MG tablet Take 1 tablet (2.5 mg total) by mouth daily.  90 tablet  8  . LORazepam (ATIVAN) 1 MG tablet 0.5 mg at bedtime.       . Multiple Vitamins-Minerals (MULTIVITAMIN WITH MINERALS) tablet Take 1 tablet by mouth 2 (two) times a week.      . simvastatin (ZOCOR) 40 MG tablet       . venlafaxine XR (EFFEXOR-XR) 37.5 MG 24 hr capsule Take 1 capsule (37.5 mg total) by mouth  daily.  30 capsule  6   No current facility-administered medications for this visit.     Allergies: No Known Allergies  Medical History: Past Medical History  Diagnosis Date  . Breast cancer   . Skin cancer   . Hyperlipidemia     Surgical History:  Past Surgical History  Procedure Laterality Date  . Breast lumpectomy  2010    left breast   . Polypectomy    . Bunionectomy Left      Review of Systems: A 10 point review of systems was conducted and is otherwise negative except for what is noted above.    Health Maintenance  Mammogram:  04/30/13 Colonoscopy: due in 2016 or 2017 (h/o polyps, last one normal) Bone  Density Scan:  Last in 06/2011 Pap Smear: 2013, normal, due in 2016 Eye Exam: 2014 Vitamin D Level: pending, last in 04/2013 normal Lipid Panel: 09/2013   Physical Exam: Blood pressure 163/89, pulse 62, temperature 98.1 F (36.7 C), temperature source Oral, resp. rate 18, height 5' 5" (1.651 m), weight 153 lb 4.8 oz (69.536 kg). GENERAL: Patient is a well appearing female in no acute distress HEENT:  Sclerae anicteric.  Oropharynx clear and moist. No ulcerations or evidence of oropharyngeal candidiasis. Neck is supple.  NODES:  No cervical, supraclavicular, or axillary lymphadenopathy palpated.  BREAST EXAM: left breast lumpectomy site without nodularity, no masses or skin changes in breast, right breast with no masses or skin changes.  Benign bilateral breast exam.  LUNGS:  Clear to auscultation bilaterally.  No wheezes or rhonchi. HEART:  Regular rate and rhythm. No murmur appreciated. ABDOMEN:  Soft, nontender.  Positive, normoactive bowel sounds. No organomegaly palpated. MSK:  No focal spinal tenderness to palpation. Full range of motion bilaterally in the upper extremities. EXTREMITIES:  No peripheral edema.  Wearing lymphedema sleeve on left arm.  SKIN:  Clear with no obvious rashes or skin changes. No nail dyscrasia. NEURO:  Nonfocal. Well oriented.  Appropriate affect. ECOG PERFORMANCE STATUS: 1 - Symptomatic but completely ambulatory   Lab Results: Lab Results  Component Value Date   WBC 4.7 11/11/2013   HGB 13.0 11/11/2013   HCT 39.8 11/11/2013   MCV 91.9 11/11/2013   PLT 253 11/11/2013     Chemistry      Component Value Date/Time   NA 143 11/11/2013 0919   K 4.0 11/11/2013 0919   CL 102 01/17/2013 1315   CO2 26 11/11/2013 0919   BUN 14.5 11/11/2013 0919   CREATININE 0.8 11/11/2013 0919      Component Value Date/Time   CALCIUM 10.1 11/11/2013 0919   ALKPHOS 78 11/11/2013 0919   AST 31 11/11/2013 0919   ALT 44 11/11/2013 0919   BILITOT 0.59 11/11/2013 0919        Assessment and Plan: Alexa Kelley 53 y.o. female with  1. h/o stage IIA ER/PR positive invasive ductal carcinoma of the left breast diagnosed in August, 2010.  See prior history above.  She is doing well and has no sign of recurrence today.  She is tolerating Letrozole daily very well and will continue this.    2. Lymphedema: The patient will continue to wear her sleeve.  She knows to contact us if she has any worsening and we can set her back up with physical therapy.    3. Survivorship.  I reviewed the patient's health maintenance with her in detail.  She is doing very well.  She is up to date with the exception of  a bone density exam.  I have ordered one today.  I recommended healthy diet, exercise, and monthly self breast exams.    The patient will return in 6 months for labs and evaluation.   She knows to call us in the interim for any questions or concerns.  We can certainly see her sooner if needed.  I spent 25 minutes counseling the patient face to face.  The total time spent in the appointment was 30 minutes.  [name], [place] [phone] 11/13/2013 8:03 AM

## 2013-11-11 NOTE — Telephone Encounter (Signed)
, °

## 2013-11-11 NOTE — Patient Instructions (Signed)
You are doing well.  You have no sign of recurrence.  Continue taking daily Letrozole.  I have ordered a bone density exam today.  Otherwise, you are up to date.  We recommend a healthy diet, exercise, and monthly breast exams.  We will see you back in 6 months.  Please call us if you have any questions or concerns.    Breast Self-Awareness Practicing breast self-awareness may pick up problems early, prevent significant medical complications, and possibly save your life. By practicing breast self-awareness, you can become familiar with how your breasts look and feel and if your breasts are changing. This allows you to notice changes early. It can also offer you some reassurance that your breast health is good. One way to learn what is normal for your breasts and whether your breasts are changing is to do a breast self-exam. If you find a lump or something that was not present in the past, it is best to contact your caregiver right away. Other findings that should be evaluated by your caregiver include nipple discharge, especially if it is bloody; skin changes or reddening; areas where the skin seems to be pulled in (retracted); or new lumps and bumps. Breast pain is seldom associated with cancer (malignancy), but should also be evaluated by a caregiver. HOW TO PERFORM A BREAST SELF-EXAM The best time to examine your breasts is 5 7 days after your menstrual period is over. During menstruation, the breasts are lumpier, and it may be more difficult to pick up changes. If you do not menstruate, have reached menopause, or had your uterus removed (hysterectomy), you should examine your breasts at regular intervals, such as monthly. If you are breastfeeding, examine your breasts after a feeding or after using a breast pump. Breast implants do not decrease the risk for lumps or tumors, so continue to perform breast self-exams as recommended. Talk to your caregiver about how to determine the difference between the  implant and breast tissue. Also, talk about the amount of pressure you should use during the exam. Over time, you will become more familiar with the variations of your breasts and more comfortable with the exam. A breast self-exam requires you to remove all your clothes above the waist. 1. Look at your breasts and nipples. Stand in front of a mirror in a room with good lighting. With your hands on your hips, push your hands firmly downward. Look for a difference in shape, contour, and size from one breast to the other (asymmetry). Asymmetry includes puckers, dips, or bumps. Also, look for skin changes, such as reddened or scaly areas on the breasts. Look for nipple changes, such as discharge, dimpling, repositioning, or redness. 2. Carefully feel your breasts. This is best done either in the shower or tub while using soapy water or when flat on your back. Place the arm (on the side of the breast you are examining) above your head. Use the pads (not the fingertips) of your three middle fingers on your opposite hand to feel your breasts. Start in the underarm area and use  inch (2 cm) overlapping circles to feel your breast. Use 3 different levels of pressure (light, medium, and firm pressure) at each circle before moving to the next circle. The light pressure is needed to feel the tissue closest to the skin. The medium pressure will help to feel breast tissue a little deeper, while the firm pressure is needed to feel the tissue close to the ribs. Continue the overlapping circles,  moving downward over the breast until you feel your ribs below your breast. Then, move one finger-width towards the center of the body. Continue to use the  inch (2 cm) overlapping circles to feel your breast as you move slowly up toward the collar bone (clavicle) near the base of the neck. Continue the up and down exam using all 3 pressures until you reach the middle of the chest. Do this with each breast, carefully feeling for lumps  or changes. 3.  Keep a written record with breast changes or normal findings for each breast. By writing this information down, you do not need to depend only on memory for size, tenderness, or location. Write down where you are in your menstrual cycle, if you are still menstruating. Breast tissue can have some lumps or thick tissue. However, see your caregiver if you find anything that concerns you.  SEEK MEDICAL CARE IF:  You see a change in shape, contour, or size of your breasts or nipples.   You see skin changes, such as reddened or scaly areas on the breasts or nipples.   You have an unusual discharge from your nipples.   You feel a new lump or unusually thick areas.  Document Released: 07/14/2005 Document Revised: 06/30/2012 Document Reviewed: 10/29/2011 [facility] Patient Information 2014 [place].

## 2013-11-12 LAB — VITAMIN D 25 HYDROXY (VIT D DEFICIENCY, FRACTURES): Vit D, 25-Hydroxy: 41 ng/mL (ref 30–89)

## 2013-11-24 ENCOUNTER — Other Ambulatory Visit: Payer: Self-pay | Admitting: Adult Health

## 2013-11-24 DIAGNOSIS — Z853 Personal history of malignant neoplasm of breast: Secondary | ICD-10-CM

## 2013-11-24 DIAGNOSIS — Z9889 Other specified postprocedural states: Secondary | ICD-10-CM

## 2013-11-28 ENCOUNTER — Other Ambulatory Visit: Payer: BC Managed Care – PPO

## 2013-12-15 ENCOUNTER — Ambulatory Visit
Admission: RE | Admit: 2013-12-15 | Discharge: 2013-12-15 | Disposition: A | Discharge status: Home / Self Care | Payer: BC Managed Care – PPO | Source: Ambulatory Visit | Attending: Adult Health | Admitting: Adult Health

## 2013-12-15 DIAGNOSIS — E2839 Other primary ovarian failure: Secondary | ICD-10-CM

## 2013-12-16 ENCOUNTER — Telehealth: Payer: Self-pay | Admitting: *Deleted

## 2013-12-16 NOTE — Telephone Encounter (Signed)
Called pt with bone density results. Results were normal. Pt verbalized understanding. No further concerns. Message to be forwarded to [place].

## 2013-12-16 NOTE — Telephone Encounter (Signed)
Message copied by Harmon Pier on Fri Dec 16, 2013 10:36 AM ------      Message from: [name]      Created: Fri Dec 16, 2013  6:34 AM       Please call patient.  Bone density is normal.            Thanks, LC       ----- Message -----         From: Rad Results In Interface         Sent: 12/15/2013   4:19 PM           To: [name], NP                   ------

## 2013-12-23 ENCOUNTER — Other Ambulatory Visit: Payer: Self-pay | Admitting: *Deleted

## 2013-12-23 DIAGNOSIS — C50912 Malignant neoplasm of unspecified site of left female breast: Secondary | ICD-10-CM

## 2013-12-23 DIAGNOSIS — Z853 Personal history of malignant neoplasm of breast: Secondary | ICD-10-CM

## 2014-04-12 ENCOUNTER — Telehealth: Payer: Self-pay | Admitting: Adult Health

## 2014-04-12 NOTE — Telephone Encounter (Signed)
Lvm advising appt chg from 10/23 to 10/29 due to [facility] out of office. Gave new appt 10/27 @ 1.45pm.

## 2014-05-05 ENCOUNTER — Other Ambulatory Visit: Payer: Self-pay | Admitting: Adult Health

## 2014-05-05 ENCOUNTER — Other Ambulatory Visit: Payer: Self-pay

## 2014-05-05 DIAGNOSIS — Z9889 Other specified postprocedural states: Secondary | ICD-10-CM

## 2014-05-05 DIAGNOSIS — Z853 Personal history of malignant neoplasm of breast: Secondary | ICD-10-CM

## 2014-05-11 ENCOUNTER — Ambulatory Visit
Admission: RE | Admit: 2014-05-11 | Discharge: 2014-05-11 | Disposition: A | Discharge status: Home / Self Care | Payer: BC Managed Care – PPO | Source: Ambulatory Visit | Attending: Adult Health | Admitting: Adult Health

## 2014-05-11 DIAGNOSIS — Z853 Personal history of malignant neoplasm of breast: Secondary | ICD-10-CM

## 2014-05-11 DIAGNOSIS — Z9889 Other specified postprocedural states: Secondary | ICD-10-CM

## 2014-05-19 ENCOUNTER — Ambulatory Visit: Payer: BC Managed Care – PPO | Admitting: Adult Health

## 2014-05-19 ENCOUNTER — Other Ambulatory Visit: Payer: BC Managed Care – PPO

## 2014-05-23 ENCOUNTER — Telehealth: Payer: Self-pay | Admitting: Adult Health

## 2014-05-23 ENCOUNTER — Ambulatory Visit (HOSPITAL_BASED_OUTPATIENT_CLINIC_OR_DEPARTMENT_OTHER): Payer: BC Managed Care – PPO | Admitting: Adult Health

## 2014-05-23 ENCOUNTER — Encounter: Payer: Self-pay | Admitting: Adult Health

## 2014-05-23 ENCOUNTER — Other Ambulatory Visit (HOSPITAL_BASED_OUTPATIENT_CLINIC_OR_DEPARTMENT_OTHER): Payer: BC Managed Care – PPO

## 2014-05-23 VITALS — BP 139/90 | HR 70 | Temp 98.2°F | Resp 18 | Ht 65.0 in | Wt 154.6 lb

## 2014-05-23 DIAGNOSIS — Z170 Estrogen receptor positive status [ER+]: Secondary | ICD-10-CM

## 2014-05-23 DIAGNOSIS — C50912 Malignant neoplasm of unspecified site of left female breast: Secondary | ICD-10-CM

## 2014-05-23 DIAGNOSIS — R922 Inconclusive mammogram: Secondary | ICD-10-CM

## 2014-05-23 DIAGNOSIS — E559 Vitamin D deficiency, unspecified: Secondary | ICD-10-CM

## 2014-05-23 DIAGNOSIS — Z853 Personal history of malignant neoplasm of breast: Secondary | ICD-10-CM

## 2014-05-23 DIAGNOSIS — I890 Lymphedema, not elsewhere classified: Secondary | ICD-10-CM

## 2014-05-23 LAB — CBC WITH DIFFERENTIAL/PLATELET
BASO%: 1.1 % (ref 0.0–2.0)
Basophils Absolute: 0.1 10*3/uL (ref 0.0–0.1)
EOS%: 2.3 % (ref 0.0–7.0)
Eosinophils Absolute: 0.1 10*3/uL (ref 0.0–0.5)
HCT: 40.7 % (ref 34.8–46.6)
HGB: 13.4 g/dL (ref 11.6–15.9)
LYMPH#: 2 10*3/uL (ref 0.9–3.3)
LYMPH%: 35.9 % (ref 14.0–49.7)
MCH: 30 pg (ref 25.1–34.0)
MCHC: 32.9 g/dL (ref 31.5–36.0)
MCV: 91.3 fL (ref 79.5–101.0)
MONO#: 0.5 10*3/uL (ref 0.1–0.9)
MONO%: 8.8 % (ref 0.0–14.0)
NEUT#: 2.9 10*3/uL (ref 1.5–6.5)
NEUT%: 51.9 % (ref 38.4–76.8)
PLATELETS: 272 10*3/uL (ref 145–400)
RBC: 4.46 10*6/uL (ref 3.70–5.45)
RDW: 12.8 % (ref 11.2–14.5)
WBC: 5.5 10*3/uL (ref 3.9–10.3)

## 2014-05-23 LAB — COMPREHENSIVE METABOLIC PANEL (CC13)
ALK PHOS: 76 U/L (ref 40–150)
ALT: 49 U/L (ref 0–55)
AST: 29 U/L (ref 5–34)
Albumin: 4.7 g/dL (ref 3.5–5.0)
Anion Gap: 11 mEq/L (ref 3–11)
BILIRUBIN TOTAL: 0.62 mg/dL (ref 0.20–1.20)
BUN: 14.9 mg/dL (ref 7.0–26.0)
CHLORIDE: 104 meq/L (ref 98–109)
CO2: 26 mEq/L (ref 22–29)
CREATININE: 0.9 mg/dL (ref 0.6–1.1)
Calcium: 10.4 mg/dL (ref 8.4–10.4)
Glucose: 112 mg/dl (ref 70–140)
Potassium: 4.6 mEq/L (ref 3.5–5.1)
Sodium: 141 mEq/L (ref 136–145)
Total Protein: 7.7 g/dL (ref 6.4–8.3)

## 2014-05-23 NOTE — Telephone Encounter (Signed)
, °

## 2014-05-23 NOTE — Progress Notes (Signed)
Hematology and Oncology Follow Up Visit  Alexa Kelley [id number] [birth year] 53 y.o. 05/23/2014 2:56 PM     Principal Diagnosis:Alexa Kelley 53 y.o. 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     Prior Therapy:  1. Status post left breast excisional biopsy on 02/09/2009 which showed a ER positive, PR positive, HER-2/neu negative tumor measuring 1.0 cm.   2. Status post left breast lumpectomy on 03/17/2009 with sentinel node biopsy, for a stage IIA, T1b N81mi+, invasive ductal carcinoma, residual tumor measured 0.4 cm, with no evidence of lymphovascular invasion, surgical margins were free of tumor, with 1/3 positive lymph nodes for micrometastasis (0.5 mm).   3. Status post level I and level II left axillary lymph node dissection with no additional tumor in 16 resected lymph nodes.   4. The patient was enrolled in ECOG 5103 study and is status post 4 cycles of chemotherapy with dose dense Doxorubicin/Cyclophosphamide (Adriamycin/Cytoxan) followed by 12 cycles of every 3 weeks Taxol/Avastin vs placebo. The study was unblinded after 3 cycles and the patient was found to be in the placebo arm of the study that had only received Taxol. She completed the study receiving Taxol every 3 weeks and completed therapy on 09/02/2009.   5. The patient completed radiation therapy in 10/2009 or 11/2009.   6. The patient underwent genetic testing and was found to be negative for the BRCA1 and BRCA2 gene mutation in 2011.   7. The patient started antiestrogen therapy with Tamoxifen in 11/2009. When she was found to have a postmenopausal estradiol and FSH levels, she was switched to Letrozole in 12/2010.   Current therapy:  Letrozole 2.$RemoveBefo'5mg'TCkwxXIYYOZ$  daily  Interim History: Alexa Kelley 53 y.o. 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  She is taking Letrozole daily.  She is tolerating it well.  She  does have hot flashes that are decreasing in frequency.  She has h/o left arm lymphedema and wears her sleeve most of the time.  It is managed well with this approach.  She denies any aches, bowel/bladder changes, new pain, or any other concerns.  She is tapering off Effexor due to easy bruising. We updated her health maintenance below.    Medications:  Current Outpatient Prescriptions  Medication Sig Dispense Refill  . aspirin 81 MG chewable tablet Chew 81 mg by mouth daily.      . cholecalciferol (VITAMIN D) 1000 UNITS tablet Take 1,000 Units by mouth 2 (two) times daily.      . COCONUT OIL PO Take 1 capsule by mouth every morning.      . fish oil-omega-3 fatty acids 1000 MG capsule Take 2 g by mouth daily.      Marland Kitchen letrozole (FEMARA) 2.5 MG tablet Take 1 tablet (2.5 mg total) by mouth daily.  90 tablet  8  . LORazepam (ATIVAN) 1 MG tablet 0.5 mg at bedtime.       Marland Kitchen losartan (COZAAR) 50 MG tablet Take 50 mg by mouth daily.      . Multiple Vitamins-Minerals (MULTIVITAMIN WITH MINERALS) tablet Take 1 tablet by mouth 2 (two) times a week.      . simvastatin (ZOCOR) 40 MG tablet       . venlafaxine XR (EFFEXOR-XR) 37.5 MG 24 hr capsule Take 1 capsule (37.5 mg total) by mouth daily.  30 capsule  6   No current facility-administered medications for this visit.  Allergies: No Known Allergies  Medical History: Past Medical History  Diagnosis Date  . Breast cancer   . Skin cancer   . Hyperlipidemia     Surgical History:  Past Surgical History  Procedure Laterality Date  . Breast lumpectomy  2010    left breast   . Polypectomy    . Bunionectomy Left      Review of Systems: A 10 point review of systems was conducted and is otherwise negative except for what is noted above.    Health Maintenance  Mammogram:  05/11/14 Colonoscopy: due in 2016 or 2017 (h/o polyps, last one normal) Bone Density Scan:  12/15/2013, normal Pap Smear: 2013, normal, due  Eye Exam: 2014 Vitamin D  Level: pending Lipid Panel: Normal, low HDL  Physical Exam: Blood pressure 139/90, pulse 70, temperature 98.2 F (36.8 C), temperature source Oral, resp. rate 18, height $RemoveBe'5\' 5"'HDkCcajtp$  (1.651 m), weight 154 lb 9.6 oz (70.126 kg). GENERAL: Patient is a well appearing female in no acute distress HEENT:  Sclerae anicteric.  Oropharynx clear and moist. No ulcerations or evidence of oropharyngeal candidiasis. Neck is supple.  NODES:  No cervical, supraclavicular, or axillary lymphadenopathy palpated.  BREAST EXAM: left breast lumpectomy site without nodularity, no masses or skin changes in breast, right breast with no masses or skin changes.  Benign bilateral breast exam.  LUNGS:  Clear to auscultation bilaterally.  No wheezes or rhonchi. HEART:  Regular rate and rhythm. No murmur appreciated. ABDOMEN:  Soft, nontender.  Positive, normoactive bowel sounds. No organomegaly palpated. MSK:  No focal spinal tenderness to palpation. Full range of motion bilaterally in the upper extremities. EXTREMITIES:  No peripheral edema.  Wearing lymphedema sleeve on left arm.  SKIN:  Clear with no obvious rashes or skin changes. No nail dyscrasia. NEURO:  Nonfocal. Well oriented.  Appropriate affect. ECOG PERFORMANCE STATUS: 1 - Symptomatic but completely ambulatory   Lab Results: Lab Results  Component Value Date   WBC 5.5 05/23/2014   HGB 13.4 05/23/2014   HCT 40.7 05/23/2014   MCV 91.3 05/23/2014   PLT 272 05/23/2014     Chemistry      Component Value Date/Time   NA 141 05/23/2014 1401   K 4.6 05/23/2014 1401   CL 102 01/17/2013 1315   CO2 26 05/23/2014 1401   BUN 14.9 05/23/2014 1401   CREATININE 0.9 05/23/2014 1401      Component Value Date/Time   CALCIUM 10.4 05/23/2014 1401   ALKPHOS 76 05/23/2014 1401   AST 29 05/23/2014 1401   ALT 49 05/23/2014 1401   BILITOT 0.62 05/23/2014 1401       Assessment and Plan: Alexa Kelley 53 y.o. female with  1. h/o stage IIA ER/PR positive invasive ductal  carcinoma of the left breast diagnosed in August, 2010.  See prior history above.  She is doing well and has no sign of recurrence today.  She is tolerating Letrozole daily very well and will continue this.    2. Lymphedema: The patient will continue to wear her sleeve.  This is managed at the time being with this intervention.  3. Survivorship.  We reviewed her mammo results which demonstrate increased breast density.  I ordered a breast MRI for her to undergo.  Alexa Kelley is otherwise up to date.  I recommended healthy diet, exercise and monthly breast exams.    The patient will return in 6 months for labs and evaluation.   She knows to call us in the interim for  any questions or concerns.  We can certainly see her sooner if needed.  I spent 25 minutes counseling the patient face to face.  The total time spent in the appointment was 30 minutes.  [name], [place] [phone] 05/23/2014 2:56 PM

## 2014-05-23 NOTE — Patient Instructions (Signed)
You are doing well.  You have no sign of recurrence.  Continue taking Letrozole.  We ordered a MRI of the breasts because of your breast density.  I recommended healthy diet, exercise, and monthly breast exams.    Breast Self-Awareness Practicing breast self-awareness may pick up problems early, prevent significant medical complications, and possibly save your life. By practicing breast self-awareness, you can become familiar with how your breasts look and feel and if your breasts are changing. This allows you to notice changes early. It can also offer you some reassurance that your breast health is good. One way to learn what is normal for your breasts and whether your breasts are changing is to do a breast self-exam. If you find a lump or something that was not present in the past, it is best to contact your caregiver right away. Other findings that should be evaluated by your caregiver include nipple discharge, especially if it is bloody; skin changes or reddening; areas where the skin seems to be pulled in (retracted); or new lumps and bumps. Breast pain is seldom associated with cancer (malignancy), but should also be evaluated by a caregiver. HOW TO PERFORM A BREAST SELF-EXAM The best time to examine your breasts is 5-7 days after your menstrual period is over. During menstruation, the breasts are lumpier, and it may be more difficult to pick up changes. If you do not menstruate, have reached menopause, or had your uterus removed (hysterectomy), you should examine your breasts at regular intervals, such as monthly. If you are breastfeeding, examine your breasts after a feeding or after using a breast pump. Breast implants do not decrease the risk for lumps or tumors, so continue to perform breast self-exams as recommended. Talk to your caregiver about how to determine the difference between the implant and breast tissue. Also, talk about the amount of pressure you should use during the exam. Over time,  you will become more familiar with the variations of your breasts and more comfortable with the exam. A breast self-exam requires you to remove all your clothes above the waist. 1. Look at your breasts and nipples. Stand in front of a mirror in a room with good lighting. With your hands on your hips, push your hands firmly downward. Look for a difference in shape, contour, and size from one breast to the other (asymmetry). Asymmetry includes puckers, dips, or bumps. Also, look for skin changes, such as reddened or scaly areas on the breasts. Look for nipple changes, such as discharge, dimpling, repositioning, or redness. 2. Carefully feel your breasts. This is best done either in the shower or tub while using soapy water or when flat on your back. Place the arm (on the side of the breast you are examining) above your head. Use the pads (not the fingertips) of your three middle fingers on your opposite hand to feel your breasts. Start in the underarm area and use  inch (2 cm) overlapping circles to feel your breast. Use 3 different levels of pressure (light, medium, and firm pressure) at each circle before moving to the next circle. The light pressure is needed to feel the tissue closest to the skin. The medium pressure will help to feel breast tissue a little deeper, while the firm pressure is needed to feel the tissue close to the ribs. Continue the overlapping circles, moving downward over the breast until you feel your ribs below your breast. Then, move one finger-width towards the center of the body. Continue to use  the  inch (2 cm) overlapping circles to feel your breast as you move slowly up toward the collar bone (clavicle) near the base of the neck. Continue the up and down exam using all 3 pressures until you reach the middle of the chest. Do this with each breast, carefully feeling for lumps or changes. 3.  Keep a written record with breast changes or normal findings for each breast. By writing this  information down, you do not need to depend only on memory for size, tenderness, or location. Write down where you are in your menstrual cycle, if you are still menstruating. Breast tissue can have some lumps or thick tissue. However, see your caregiver if you find anything that concerns you.  SEEK MEDICAL CARE IF:  You see a change in shape, contour, or size of your breasts or nipples.   You see skin changes, such as reddened or scaly areas on the breasts or nipples.   You have an unusual discharge from your nipples.   You feel a new lump or unusually thick areas.  Document Released: 07/14/2005 Document Revised: 06/30/2012 Document Reviewed: 10/29/2011 [facility] Patient Information 2015 [place], [place]. This information is not intended to replace advice given to you by your health care provider. Make sure you discuss any questions you have with your health care provider.

## 2014-05-24 LAB — VITAMIN D 25 HYDROXY (VIT D DEFICIENCY, FRACTURES): Vit D, 25-Hydroxy: 47 ng/mL (ref 30–89)

## 2014-05-30 ENCOUNTER — Ambulatory Visit (HOSPITAL_COMMUNITY)
Admission: RE | Admit: 2014-05-30 | Discharge: 2014-05-30 | Disposition: A | Discharge status: Home / Self Care | Payer: BC Managed Care – PPO | Source: Ambulatory Visit | Attending: Radiology | Admitting: Radiology

## 2014-05-30 DIAGNOSIS — N6489 Other specified disorders of breast: Secondary | ICD-10-CM | POA: Diagnosis not present

## 2014-05-30 DIAGNOSIS — Z923 Personal history of irradiation: Secondary | ICD-10-CM | POA: Diagnosis not present

## 2014-05-30 DIAGNOSIS — R922 Inconclusive mammogram: Secondary | ICD-10-CM | POA: Diagnosis present

## 2014-05-30 DIAGNOSIS — Z853 Personal history of malignant neoplasm of breast: Secondary | ICD-10-CM | POA: Insufficient documentation

## 2014-05-30 MED ORDER — GADOBENATE DIMEGLUMINE 529 MG/ML IV SOLN
15.0000 mL | Freq: Once | INTRAVENOUS | Status: AC | PRN
  Administered 2014-05-30: 13 mL via INTRAVENOUS

## 2014-05-31 ENCOUNTER — Other Ambulatory Visit: Payer: Self-pay | Admitting: Adult Health

## 2014-05-31 DIAGNOSIS — R928 Other abnormal and inconclusive findings on diagnostic imaging of breast: Secondary | ICD-10-CM

## 2014-06-02 ENCOUNTER — Telehealth: Payer: Self-pay | Admitting: *Deleted

## 2014-06-02 NOTE — Telephone Encounter (Signed)
Faxed office notes, labs & radiology reports to [facility] for Research trial study pt is on.

## 2014-06-08 ENCOUNTER — Other Ambulatory Visit: Payer: BC Managed Care – PPO

## 2014-06-12 ENCOUNTER — Other Ambulatory Visit: Payer: Self-pay | Admitting: Adult Health

## 2014-06-12 ENCOUNTER — Ambulatory Visit: Admission: RE | Admit: 2014-06-12 | Payer: BC Managed Care – PPO | Source: Ambulatory Visit

## 2014-06-12 ENCOUNTER — Ambulatory Visit
Admission: RE | Admit: 2014-06-12 | Discharge: 2014-06-12 | Disposition: A | Discharge status: Home / Self Care | Payer: BC Managed Care – PPO | Source: Ambulatory Visit | Attending: Adult Health | Admitting: Adult Health

## 2014-06-12 DIAGNOSIS — R928 Other abnormal and inconclusive findings on diagnostic imaging of breast: Secondary | ICD-10-CM

## 2014-06-12 MED ORDER — GADOBENATE DIMEGLUMINE 529 MG/ML IV SOLN: 14.0000 mL | Freq: Once | INTRAVENOUS | Status: DC | PRN

## 2014-06-12 MED ORDER — GADOBENATE DIMEGLUMINE 529 MG/ML IV SOLN
14.0000 mL | Freq: Once | INTRAVENOUS | Status: AC | PRN
  Administered 2014-06-12: 14 mL via INTRAVENOUS

## 2014-06-13 ENCOUNTER — Encounter: Payer: Self-pay | Admitting: Adult Health

## 2014-06-14 ENCOUNTER — Telehealth: Payer: Self-pay | Admitting: Hematology and Oncology

## 2014-06-14 ENCOUNTER — Other Ambulatory Visit: Payer: Self-pay | Admitting: Adult Health

## 2014-06-14 NOTE — Telephone Encounter (Signed)
, °

## 2014-06-14 NOTE — Telephone Encounter (Signed)
Called patient and discussed MRI results and the fact that she would see Dr. Lindi Adie at her next appointment and that they could discuss whether or not to get a MRI in 6 months as recommended.  Patient verbalized understanding.  Will return for f/u in 6 months or sooner if needed.  [name], [place] [phone]

## 2014-07-04 ENCOUNTER — Encounter: Payer: Self-pay | Admitting: Adult Health

## 2014-07-07 ENCOUNTER — Telehealth: Payer: Self-pay

## 2014-07-07 NOTE — Telephone Encounter (Signed)
Pt returned my call re: Appeal forms.  Pt requests form be mailed to her home.  Forms put in the mail today.  Copy sent to scan.

## 2014-09-26 ENCOUNTER — Other Ambulatory Visit (HOSPITAL_COMMUNITY)
Admission: RE | Admit: 2014-09-26 | Discharge: 2014-09-26 | Disposition: A | Discharge status: Home / Self Care | Payer: BC Managed Care – PPO | Source: Ambulatory Visit | Attending: Family Medicine | Admitting: Family Medicine

## 2014-09-26 ENCOUNTER — Other Ambulatory Visit: Payer: Self-pay | Admitting: Family Medicine

## 2014-09-26 DIAGNOSIS — Z01419 Encounter for gynecological examination (general) (routine) without abnormal findings: Secondary | ICD-10-CM | POA: Diagnosis not present

## 2014-09-27 LAB — CYTOLOGY - PAP

## 2014-11-08 ENCOUNTER — Telehealth: Payer: Self-pay | Admitting: *Deleted

## 2014-11-08 ENCOUNTER — Other Ambulatory Visit: Payer: Self-pay | Admitting: *Deleted

## 2014-11-08 ENCOUNTER — Telehealth: Payer: Self-pay | Admitting: Hematology and Oncology

## 2014-11-08 DIAGNOSIS — Z853 Personal history of malignant neoplasm of breast: Secondary | ICD-10-CM

## 2014-11-08 NOTE — Telephone Encounter (Signed)
Discussed with Dr. Lindi Adie. Returned patient's call. Scheduled her for labs and visit with Selena Lesser for tomorrow at 11:00. Patient also stated that she has a history of fatty infiltrate.

## 2014-11-08 NOTE — Telephone Encounter (Signed)
Patient called in regarding some issues with pressure in liver region and she recently had some labs done and she said they were elevated. Transferred to triage nurse

## 2014-11-08 NOTE — Telephone Encounter (Signed)
Received call from patient stating she has been experiencing some pressure under her rib cage on the right side and is worried that it could possibly be cancer. 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 My AST was 70 when I saw my primary care doctor back in February. I do have a hemangioma there so that might be all it is but I do not see Dr. Lindi Adie until April 28th and this is really worrying me." Told patient this information will be sent to Dr. Lindi Adie for review.

## 2014-11-09 ENCOUNTER — Ambulatory Visit (HOSPITAL_BASED_OUTPATIENT_CLINIC_OR_DEPARTMENT_OTHER): Payer: BC Managed Care – PPO | Admitting: Nurse Practitioner

## 2014-11-09 ENCOUNTER — Other Ambulatory Visit (HOSPITAL_BASED_OUTPATIENT_CLINIC_OR_DEPARTMENT_OTHER): Payer: BC Managed Care – PPO

## 2014-11-09 ENCOUNTER — Other Ambulatory Visit (HOSPITAL_COMMUNITY)
Admission: RE | Admit: 2014-11-09 | Discharge: 2014-11-09 | Disposition: A | Discharge status: Home / Self Care | Payer: BC Managed Care – PPO | Source: Ambulatory Visit | Attending: Hematology and Oncology | Admitting: Hematology and Oncology

## 2014-11-09 VITALS — BP 167/87 | HR 69 | Temp 98.0°F | Resp 20 | Ht 65.0 in | Wt 158.1 lb

## 2014-11-09 DIAGNOSIS — R740 Nonspecific elevation of levels of transaminase and lactic acid dehydrogenase [LDH]: Secondary | ICD-10-CM | POA: Diagnosis not present

## 2014-11-09 DIAGNOSIS — I890 Lymphedema, not elsewhere classified: Secondary | ICD-10-CM | POA: Diagnosis not present

## 2014-11-09 DIAGNOSIS — Z853 Personal history of malignant neoplasm of breast: Secondary | ICD-10-CM

## 2014-11-09 DIAGNOSIS — C50912 Malignant neoplasm of unspecified site of left female breast: Secondary | ICD-10-CM

## 2014-11-09 DIAGNOSIS — R7401 Elevation of levels of liver transaminase levels: Secondary | ICD-10-CM

## 2014-11-09 DIAGNOSIS — R1011 Right upper quadrant pain: Secondary | ICD-10-CM | POA: Diagnosis not present

## 2014-11-09 DIAGNOSIS — R1010 Upper abdominal pain, unspecified: Secondary | ICD-10-CM

## 2014-11-09 DIAGNOSIS — R739 Hyperglycemia, unspecified: Secondary | ICD-10-CM

## 2014-11-09 LAB — COMPREHENSIVE METABOLIC PANEL
ALK PHOS: 64 U/L (ref 39–117)
ALT: 47 U/L — ABNORMAL HIGH (ref 0–35)
AST: 32 U/L (ref 0–37)
Albumin: 5 g/dL (ref 3.5–5.2)
Anion gap: 9 (ref 5–15)
BUN: 17 mg/dL (ref 6–23)
CALCIUM: 9.5 mg/dL (ref 8.4–10.5)
CO2: 28 mmol/L (ref 19–32)
CREATININE: 0.77 mg/dL (ref 0.50–1.10)
Chloride: 102 mmol/L (ref 96–112)
GFR calc Af Amer: >90 mL/min (ref >90)
GFR calc non Af Amer: >90 mL/min (ref >90)
GLUCOSE: 189 mg/dL — AB (ref 70–99)
Potassium: 3.8 mmol/L (ref 3.5–5.1)
Sodium: 139 mmol/L (ref 135–145)
TOTAL PROTEIN: 8 g/dL (ref 6.0–8.3)
Total Bilirubin: 0.6 mg/dL (ref 0.3–1.2)

## 2014-11-09 LAB — CBC WITH DIFFERENTIAL/PLATELET
BASO%: 0.9 % (ref 0.0–2.0)
Basophils Absolute: 0 10*3/uL (ref 0.0–0.1)
EOS%: 5.2 % (ref 0.0–7.0)
Eosinophils Absolute: 0.2 10*3/uL (ref 0.0–0.5)
HCT: 40.3 % (ref 34.8–46.6)
HGB: 13.2 g/dL (ref 11.6–15.9)
LYMPH%: 35 % (ref 14.0–49.7)
MCH: 29.9 pg (ref 25.1–34.0)
MCHC: 32.7 g/dL (ref 31.5–36.0)
MCV: 91.4 fL (ref 79.5–101.0)
MONO#: 0.4 10*3/uL (ref 0.1–0.9)
MONO%: 7.7 % (ref 0.0–14.0)
NEUT#: 2.4 10*3/uL (ref 1.5–6.5)
NEUT%: 51.2 % (ref 38.4–76.8)
Platelets: 265 10*3/uL (ref 145–400)
RBC: 4.41 10*6/uL (ref 3.70–5.45)
RDW: 12.5 % (ref 11.2–14.5)
WBC: 4.7 10*3/uL (ref 3.9–10.3)
lymph#: 1.6 10*3/uL (ref 0.9–3.3)

## 2014-11-10 ENCOUNTER — Encounter: Payer: Self-pay | Admitting: Nurse Practitioner

## 2014-11-10 DIAGNOSIS — R1010 Upper abdominal pain, unspecified: Secondary | ICD-10-CM | POA: Insufficient documentation

## 2014-11-10 DIAGNOSIS — R739 Hyperglycemia, unspecified: Secondary | ICD-10-CM | POA: Insufficient documentation

## 2014-11-10 DIAGNOSIS — I890 Lymphedema, not elsewhere classified: Secondary | ICD-10-CM | POA: Insufficient documentation

## 2014-11-10 DIAGNOSIS — R7401 Elevation of levels of liver transaminase levels: Secondary | ICD-10-CM | POA: Insufficient documentation

## 2014-11-10 DIAGNOSIS — R740 Nonspecific elevation of levels of transaminase and lactic acid dehydrogenase [LDH]: Secondary | ICD-10-CM

## 2014-11-10 DIAGNOSIS — C50512 Malignant neoplasm of lower-outer quadrant of left female breast: Secondary | ICD-10-CM | POA: Insufficient documentation

## 2014-11-10 NOTE — Assessment & Plan Note (Signed)
Patient brought in labs from her primary care provider which were obtained on 10/02/2014 which revealed a slightly elevated AST of 242.  ALT or bilirubin was not drawn.    Labs drawn today revealed an AST within normal range at 32; and an ALT slightly elevated at 47.  Bilirubin is normal.  On exam-no abdominal tenderness or mass noted.  No specific abdominal abnormalities on exam.  Will continue to monitor closely.

## 2014-11-10 NOTE — Assessment & Plan Note (Signed)
Patient has some trace left upper extremity lymphedema; and is wearing her compression sleeve today.

## 2014-11-10 NOTE — Assessment & Plan Note (Signed)
Patient is status post lumpectomy, chemotherapy, and radiation therapy all completed by 2011.  Patient currently undergoing Mitrazol oral therapy with fairly well tolerance.  Patient is scheduled to return on 11/23/2014 for labs and a follow-up visit.

## 2014-11-10 NOTE — Assessment & Plan Note (Signed)
Blood sugar elevated to 189 today.  Blood sugar elevated to 134 while at primary care physician's office in March 2016.  Advised patient to follow-up with her primary care provider  regarding further monitoring/management of hyperglycemia.

## 2014-11-10 NOTE — Progress Notes (Signed)
SYMPTOM MANAGEMENT CLINIC   HPI: Alexa Kelley 54 y.o. female diagnosed with breast cancer.  Patient is status post lumpectomy, chemotherapy, and radiation therapy completed in 2011.  Currently undergoing Mitrazol oral therapy.  Patient reports some mild, vague pain to her right upper quadrant region for the past several days.  Patient states that her AST liver enzyme was slightly elevated to 42 at her last lab draw; and she is concerned that her previously diagnosed left breast cancer has metastasized to her right upper abdomen area.  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  She denies any nausea, vomiting, diarrhea, or constipation.  She denies any UTI symptoms.  She denies any recent fevers or chills.  HPI  ROS  Past Medical History  Diagnosis Date  . Breast cancer   . Skin cancer   . Hyperlipidemia     Past Surgical History  Procedure Laterality Date  . Breast lumpectomy  2010    left breast   . Polypectomy    . Bunionectomy Left     has Breast cancer; Pain of upper abdomen; Lymphedema; Transaminitis; and Hyperglycemia on her problem list.    has No Known Allergies.    Medication List       This list is accurate as of: 11/09/14 11:59 PM.  Always use your most recent med list.               aspirin 81 MG chewable tablet  Chew 81 mg by mouth daily.     cholecalciferol 1000 UNITS tablet  Commonly known as:  VITAMIN D  Take 1,000 Units by mouth 2 (two) times daily.     COCONUT OIL PO  Take 1 capsule by mouth every morning.     fish oil-omega-3 fatty acids 1000 MG capsule  Take 2 g by mouth daily.     letrozole 2.5 MG tablet  Commonly known as:  FEMARA  Take 1 tablet (2.5 mg total) by mouth daily.     LORazepam 1 MG tablet  Commonly known as:  ATIVAN  0.5 mg at bedtime.     losartan 50 MG tablet  Commonly known as:  COZAAR  Take 50 mg by mouth daily.     multivitamin with minerals tablet  Take 1 tablet by  mouth 2 (two) times a week.     simvastatin 40 MG tablet  Commonly known as:  ZOCOR     venlafaxine XR 37.5 MG 24 hr capsule  Commonly known as:  EFFEXOR-XR  Take 1 capsule (37.5 mg total) by mouth daily.         PHYSICAL EXAMINATION  Oncology Vitals 11/09/2014 05/23/2014 11/11/2013 05/13/2013 01/17/2013 10/29/2012 04/13/2012  Height 165 _0  cm 166 cm  Weight 71.714 kg 70.126 kg 69.536 kg 70.353 kg 73.211 kg 74.163 kg 74.662 kg  Weight (lbs) 158 lbs 2 oz 154 lbs 10 oz 153 lbs 5 oz 155 lbs 2 oz 161 lbs 6 oz 163 lbs 8 oz 164 lbs 10 oz  BMI (kg/m2) 26.31 kg/m2 25.73 kg/m2 25.51 kg/m2 25.81 kg/m2 26.86 kg/m2 27.21 kg/m2 26.97 kg/m2  Temp 98 98.2 98.1 98.1 98.3 98.3 98.3  Pulse 69 70 62 76 74 61 73  Resp _1 SpO2 100 - - - - - -  BSA (m2) 1.81 m2 1.79 m2 1.79 m2 1.8 m2 1.83 m2 1.84 m2 1.86  m2   BP Readings from Last 3 Encounters:  11/09/14 167/87  05/23/14 139/90  11/11/13 163/89    Physical Exam  Constitutional: She is oriented to person, place, and time and well-developed, well-nourished, and in no distress.  HENT:  Head: Normocephalic and atraumatic.  Mouth/Throat: Oropharynx is clear and moist.  Eyes: Conjunctivae and EOM are normal. Pupils are equal, round, and reactive to light. Right eye exhibits no discharge. Left eye exhibits no discharge. No scleral icterus.  Neck: Normal range of motion. Neck supple. No JVD present. No tracheal deviation present. No thyromegaly present.  Cardiovascular: Normal rate, regular rhythm, normal heart sounds and intact distal pulses.   Pulmonary/Chest: Effort normal and breath sounds normal. No respiratory distress. She has no wheezes. She has no rales. She exhibits no tenderness.  Abdominal: Soft. Bowel sounds are normal. She exhibits no distension and no mass. There is no tenderness. There is no rebound and no guarding.  Musculoskeletal: Normal range of motion. She exhibits no edema or tenderness.    Lymphadenopathy:    She has no cervical adenopathy.  Neurological: She is alert and oriented to person, place, and time. Gait normal.  Skin: Skin is warm and dry. No rash noted. No erythema. No pallor.  Psychiatric:  Patient appears anxious on exam.  Nursing note and vitals reviewed.   LABORATORY DATA:. Appointment on 11/09/2014  Component Date Value Ref Range Status  . WBC 11/09/2014 4.7  3.9 - 10.3 10e3/uL Final  . NEUT# 11/09/2014 2.4  1.5 - 6.5 10e3/uL Final  . HGB 11/09/2014 13.2  11.6 - 15.9 g/dL Final  . HCT 11/09/2014 40.3  34.8 - 46.6 % Final  . Platelets 11/09/2014 265  145 - 400 10e3/uL Final  . MCV 11/09/2014 91.4  79.5 - 101.0 fL Final  . MCH 11/09/2014 29.9  25.1 - 34.0 pg Final  . MCHC 11/09/2014 32.7  31.5 - 36.0 g/dL Final  . RBC 11/09/2014 4.41  3.70 - 5.45 10e6/uL Final  . RDW 11/09/2014 12.5  11.2 - 14.5 % Final  . lymph# 11/09/2014 1.6  0.9 - 3.3 10e3/uL Final  . MONO# 11/09/2014 0.4  0.1 - 0.9 10e3/uL Final  . Eosinophils Absolute 11/09/2014 0.2  0.0 - 0.5 10e3/uL Final  . Basophils Absolute 11/09/2014 0.0  0.0 - 0.1 10e3/uL Final  . NEUT% 11/09/2014 51.2  38.4 - 76.8 % Final  . LYMPH% 11/09/2014 35.0  14.0 - 49.7 % Final  . MONO% 11/09/2014 7.7  0.0 - 14.0 % Final  . EOS% 11/09/2014 5.2  0.0 - 7.0 % Final  . BASO% 11/09/2014 0.9  0.0 - 2.0 % Final  Hospital Outpatient Visit on 11/09/2014  Component Date Value Ref Range Status  . Sodium 11/09/2014 139  135 - 145 mmol/L Final  . Potassium 11/09/2014 3.8  3.5 - 5.1 mmol/L Final  . Chloride 11/09/2014 102  96 - 112 mmol/L Final  . CO2 11/09/2014 28  19 - 32 mmol/L Final  . Glucose, Bld 11/09/2014 189* 70 - 99 mg/dL Final  . BUN 11/09/2014 17  6 - 23 mg/dL Final  . Creatinine, Ser 11/09/2014 0.77  0.50 - 1.10 mg/dL Final  . Calcium 11/09/2014 9.5  8.4 - 10.5 mg/dL Final  . Total Protein 11/09/2014 8.0  6.0 - 8.3 g/dL Final  . Albumin 11/09/2014 5.0  3.5 - 5.2 g/dL Final  . AST 11/09/2014 32  0 - 37 U/L  Final  . ALT 11/09/2014 47* 0 - 35 U/L Final  .  Alkaline Phosphatase 11/09/2014 64  39 - 117 U/L Final  . Total Bilirubin 11/09/2014 0.6  0.3 - 1.2 mg/dL Final  . GFR calc non Af Amer 11/09/2014 >90  >90 mL/min Final  . GFR calc Af Amer 11/09/2014 >90  >90 mL/min Final   Comment: (NOTE) The eGFR has been calculated using the CKD EPI equation. This calculation has not been validated in all clinical situations. eGFR's persistently <90 mL/min signify possible Chronic Kidney Disease.   . Anion gap 11/09/2014 9  5 - 15 Final     RADIOGRAPHIC STUDIES: No results found.  ASSESSMENT/PLAN:    Breast cancer Patient is status post lumpectomy, chemotherapy, and radiation therapy all completed by 2011.  Patient currently undergoing Mitrazol oral therapy with fairly well tolerance.  Patient is scheduled to return on 11/23/2014 for labs and a follow-up visit.     Pain of upper abdomen Patient reports some mild, vague pain to her right upper quadrant region for the past several days.  Patient states that her AST liver enzyme was slightly elevated to 42 at her last lab draw; and she is concerned that her previously diagnosed left breast cancer has metastasized to her right upper abdomen area.  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  She denies any nausea, vomiting, diarrhea, or constipation.  She denies any UTI symptoms.  She denies any recent fevers or chills.  On exam-right upper quadrant abdomen with no tenderness or mass with palpation.  Abdomen soft.  Bowel sounds positive in all 4 quads.  No rebound tenderness.  Labs drawn today revealed AST down to 32*, ALT slightly elevated at 47, and bilirubin normal at 0.6.  Advised patient to call/return or go directly to the emergency department for any worsening symptoms whatsoever.  Also, advised patient that she should keep her planned appointment with Dr. Lindi Adie for later this month as  well.   Lymphedema Patient has some trace left upper extremity lymphedema; and is wearing her compression sleeve today.   Transaminitis Patient brought in labs from her primary care provider which were obtained on 10/02/2014 which revealed a slightly elevated AST of 242.  ALT or bilirubin was not drawn.    Labs drawn today revealed an AST within normal range at 32; and an ALT slightly elevated at 47.  Bilirubin is normal.  On exam-no abdominal tenderness or mass noted.  No specific abdominal abnormalities on exam.  Will continue to monitor closely.   Hyperglycemia Blood sugar elevated to 189 today.  Blood sugar elevated to 134 while at primary care physician's office in March 2016.  Advised patient to follow-up with her primary care provider  regarding further monitoring/management of hyperglycemia.   Patient stated understanding of all instructions; and was in agreement with this plan of care. The patient knows to call the clinic with any problems, questions or concerns.   Review/collaboration with Dr. Lindi Adie regarding all aspects of patient's visit today.   Total time spent with patient was 25 minutes;  with greater than 75 percent of that time spent in face to face counseling regarding patient's symptoms,  and coordination of care and follow up.  Disclaimer: This note was dictated with voice recognition software. Similar sounding words can inadvertently be transcribed and may not be corrected upon review.   Drue Second, NP 11/10/2014

## 2014-11-10 NOTE — Assessment & Plan Note (Signed)
Patient reports some mild, vague pain to her right upper quadrant region for the past several days.  Patient states that her AST liver enzyme was slightly elevated to 42 at her last lab draw; and she is concerned that her previously diagnosed left breast cancer has metastasized to her right upper abdomen area.  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  She denies any nausea, vomiting, diarrhea, or constipation.  She denies any UTI symptoms.  She denies any recent fevers or chills.  On exam-right upper quadrant abdomen with no tenderness or mass with palpation.  Abdomen soft.  Bowel sounds positive in all 4 quads.  No rebound tenderness.  Labs drawn today revealed AST down to 32*, ALT slightly elevated at 47, and bilirubin normal at 0.6.  Advised patient to call/return or go directly to the emergency department for any worsening symptoms whatsoever.  Also, advised patient that she should keep her planned appointment with Dr. Lindi Adie for later this month as well.

## 2014-11-13 DIAGNOSIS — C50919 Malignant neoplasm of unspecified site of unspecified female breast: Secondary | ICD-10-CM

## 2014-11-13 DIAGNOSIS — R1010 Upper abdominal pain, unspecified: Secondary | ICD-10-CM

## 2014-11-13 NOTE — Progress Notes (Signed)
Let pt know Dr. Lindi Adie had ordered abdominal ultrasound and that I would follow to ensure it was scheduled.  Pt voiced understanding.

## 2014-11-21 ENCOUNTER — Ambulatory Visit (HOSPITAL_COMMUNITY)
Admission: RE | Admit: 2014-11-21 | Discharge: 2014-11-21 | Disposition: A | Discharge status: Home / Self Care | Payer: BC Managed Care – PPO | Source: Ambulatory Visit | Attending: Hematology and Oncology | Admitting: Hematology and Oncology

## 2014-11-21 DIAGNOSIS — R140 Abdominal distension (gaseous): Secondary | ICD-10-CM | POA: Diagnosis not present

## 2014-11-21 DIAGNOSIS — R1011 Right upper quadrant pain: Secondary | ICD-10-CM | POA: Diagnosis not present

## 2014-11-21 DIAGNOSIS — Z853 Personal history of malignant neoplasm of breast: Secondary | ICD-10-CM | POA: Insufficient documentation

## 2014-11-21 DIAGNOSIS — R1010 Upper abdominal pain, unspecified: Secondary | ICD-10-CM

## 2014-11-21 DIAGNOSIS — C50919 Malignant neoplasm of unspecified site of unspecified female breast: Secondary | ICD-10-CM

## 2014-11-22 ENCOUNTER — Other Ambulatory Visit: Payer: Self-pay | Admitting: *Deleted

## 2014-11-22 DIAGNOSIS — C50919 Malignant neoplasm of unspecified site of unspecified female breast: Secondary | ICD-10-CM

## 2014-11-23 ENCOUNTER — Other Ambulatory Visit: Payer: BC Managed Care – PPO

## 2014-11-23 ENCOUNTER — Ambulatory Visit (HOSPITAL_BASED_OUTPATIENT_CLINIC_OR_DEPARTMENT_OTHER): Payer: BC Managed Care – PPO | Admitting: Hematology and Oncology

## 2014-11-23 ENCOUNTER — Ambulatory Visit: Payer: BC Managed Care – PPO | Admitting: Adult Health

## 2014-11-23 ENCOUNTER — Other Ambulatory Visit (HOSPITAL_BASED_OUTPATIENT_CLINIC_OR_DEPARTMENT_OTHER): Payer: BC Managed Care – PPO

## 2014-11-23 ENCOUNTER — Telehealth: Payer: Self-pay | Admitting: Hematology and Oncology

## 2014-11-23 VITALS — BP 136/85 | HR 66 | Temp 98.5°F | Resp 18 | Ht 65.0 in | Wt 159.6 lb

## 2014-11-23 DIAGNOSIS — C50912 Malignant neoplasm of unspecified site of left female breast: Secondary | ICD-10-CM

## 2014-11-23 DIAGNOSIS — K760 Fatty (change of) liver, not elsewhere classified: Secondary | ICD-10-CM

## 2014-11-23 DIAGNOSIS — Z170 Estrogen receptor positive status [ER+]: Secondary | ICD-10-CM

## 2014-11-23 DIAGNOSIS — C50919 Malignant neoplasm of unspecified site of unspecified female breast: Secondary | ICD-10-CM

## 2014-11-23 LAB — COMPREHENSIVE METABOLIC PANEL (CC13)
ALT: 39 U/L (ref 0–55)
AST: 26 U/L (ref 5–34)
Albumin: 4.2 g/dL (ref 3.5–5.0)
Alkaline Phosphatase: 70 U/L (ref 40–150)
Anion Gap: 10 mEq/L (ref 3–11)
BUN: 14.9 mg/dL (ref 7.0–26.0)
CALCIUM: 9.5 mg/dL (ref 8.4–10.4)
CHLORIDE: 104 meq/L (ref 98–109)
CO2: 26 mEq/L (ref 22–29)
Creatinine: 0.8 mg/dL (ref 0.6–1.1)
EGFR: 81 mL/min/{1.73_m2} — ABNORMAL LOW (ref >90)
Glucose: 124 mg/dl (ref 70–140)
POTASSIUM: 3.8 meq/L (ref 3.5–5.1)
SODIUM: 140 meq/L (ref 136–145)
TOTAL PROTEIN: 7 g/dL (ref 6.4–8.3)
Total Bilirubin: 0.59 mg/dL (ref 0.20–1.20)

## 2014-11-23 LAB — CBC WITH DIFFERENTIAL/PLATELET
BASO%: 1.1 % (ref 0.0–2.0)
BASOS ABS: 0.1 10*3/uL (ref 0.0–0.1)
EOS%: 4.8 % (ref 0.0–7.0)
Eosinophils Absolute: 0.2 10*3/uL (ref 0.0–0.5)
HCT: 37.7 % (ref 34.8–46.6)
HGB: 12.6 g/dL (ref 11.6–15.9)
LYMPH%: 39.7 % (ref 14.0–49.7)
MCH: 30.7 pg (ref 25.1–34.0)
MCHC: 33.4 g/dL (ref 31.5–36.0)
MCV: 91.7 fL (ref 79.5–101.0)
MONO#: 0.4 10*3/uL (ref 0.1–0.9)
MONO%: 8.2 % (ref 0.0–14.0)
NEUT%: 46.2 % (ref 38.4–76.8)
NEUTROS ABS: 2 10*3/uL (ref 1.5–6.5)
Platelets: 237 10*3/uL (ref 145–400)
RBC: 4.11 10*6/uL (ref 3.70–5.45)
RDW: 12.5 % (ref 11.2–14.5)
WBC: 4.4 10*3/uL (ref 3.9–10.3)
lymph#: 1.8 10*3/uL (ref 0.9–3.3)

## 2014-11-23 NOTE — Telephone Encounter (Signed)
gave and printed appt sched and avs fo rpt for OCT and NOV.Marland KitchenMarland Kitchen

## 2014-11-23 NOTE — Assessment & Plan Note (Signed)
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 Hot flashes: Was on Effexor discontinued due to easy bruising 2. Lymphedema: Patient uses a sleeve  Breast Cancer Surveillance: 1. Breast exam 11/23/2014: Normal 2. Mammogram 05/11/2014 No abnormalities. Postsurgical changes. Breast Density Category C. I recommended that she get 3-D mammograms for surveillance. MRI breast 05/30/2014: 0.8 cm of non-mass enhancement 11:30 position left breast they attempted to MRI guided biopsy but no abnormality was seen biopsy at that time. The recommended a six-month follow-up MRI. Discussed the differences between different breast density categories.

## 2014-11-23 NOTE — Progress Notes (Signed)
Patient Care Team: Antony Blackbird, MD as PCP - General (Family Medicine)  DIAGNOSIS: No matching staging information was found for the patient.  SUMMARY OF ONCOLOGIC HISTORY:   Breast cancer, left breast   01/30/2009 Initial Diagnosis left breast excisional biopsy on 02/09/2009 which showed a ER positive, PR positive, HER-2/neu negative tumor measuring 1.0 cm   03/16/2009 Surgery stage IIA, T1b N28mi+, invasive ductal carcinoma, residual tumor measured 0.4 cm, with no evidence of lymphovascular invasion, surgical margins were free of tumor, with 1/3 positive lymph nodes for micrometastasis (0.5 mm). 0/16 LN   04/10/2009 - 09/03/2009 Chemotherapy ECOG 5103 study and is status post 4 cycles of chemotherapy with dose dense Doxorubicin/Cyclophosphamide followed by 12 cycles of every 3 weeks Taxol/Avastin vs placebo (Patient was on placebo arm)   11/06/2009 - 12/21/2009 Radiation Therapy Adjuvant radiation therapy   11/22/2009 Procedure Genetic testing negative for BRCA1 and BRCA2 gene mutations   12/04/2009 -  Anti-estrogen oral therapy Tamoxifen started May 2011 switched to letrozole June 2012 where she became postmenopausal    CHIEF COMPLIANT:   INTERVAL HISTORY: Alexa Kelley is a     REVIEW OF SYSTEMS:   Constitutional: Denies fevers, chills or abnormal weight loss Eyes: Denies blurriness of vision Ears, nose, mouth, throat, and face: Denies mucositis or sore throat Respiratory: Denies cough, dyspnea or wheezes Cardiovascular: Denies palpitation, chest discomfort or lower extremity swelling Gastrointestinal:  Denies nausea, heartburn or change in bowel habits Skin: Denies abnormal skin rashes Lymphatics: Denies new lymphadenopathy or easy bruising Neurological:Denies numbness, tingling or new weaknesses Behavioral/Psych: Mood is stable, no new changes  Breast:  denies any pain or lumps or nodules in either breasts All other systems were reviewed with the patient and are negative.  I have  reviewed the past medical history, past surgical history, social history and family history with the patient and they are unchanged from previous note.  ALLERGIES:  has No Known Allergies.  MEDICATIONS:  Current Outpatient Prescriptions  Medication Sig Dispense Refill  . aspirin 81 MG chewable tablet Chew 81 mg by mouth daily.    . cholecalciferol (VITAMIN D) 1000 UNITS tablet Take 1,000 Units by mouth 2 (two) times daily.    . ciprofloxacin (CIPRO) 500 MG tablet     . COCONUT OIL PO Take 1 capsule by mouth every morning.    . fish oil-omega-3 fatty acids 1000 MG capsule Take 2 g by mouth daily.    Marland Kitchen letrozole (FEMARA) 2.5 MG tablet Take 1 tablet (2.5 mg total) by mouth daily. 90 tablet 8  . LORazepam (ATIVAN) 1 MG tablet 0.5 mg at bedtime.     Marland Kitchen losartan (COZAAR) 50 MG tablet Take 50 mg by mouth daily.    . Multiple Vitamins-Minerals (MULTIVITAMIN WITH MINERALS) tablet Take 1 tablet by mouth 2 (two) times a week.    . simvastatin (ZOCOR) 40 MG tablet     . venlafaxine XR (EFFEXOR-XR) 37.5 MG 24 hr capsule Take 1 capsule (37.5 mg total) by mouth daily. 30 capsule 6   No current facility-administered medications for this visit.    PHYSICAL EXAMINATION: ECOG PERFORMANCE STATUS: 0 - Asymptomatic  Filed Vitals:   11/23/14 0958  BP: 136/85  Pulse: 66  Temp: 98.5 F (36.9 C)  Resp: 18   Filed Weights   11/23/14 0958  Weight: 159 lb 9.6 oz (72.394 kg)    GENERAL:alert, no distress and comfortable SKIN: skin color, texture, turgor are normal, no rashes or significant lesions EYES: normal, Conjunctiva are  pink and non-injected, sclera clear OROPHARYNX:no exudate, no erythema and lips, buccal mucosa, and tongue normal  NECK: supple, thyroid normal size, non-tender, without nodularity LYMPH:  no palpable lymphadenopathy in the cervical, axillary or inguinal LUNGS: clear to auscultation and percussion with normal breathing effort HEART: regular rate & rhythm and no murmurs and no  lower extremity edema ABDOMEN:abdomen soft, non-tender and normal bowel sounds Musculoskeletal:no cyanosis of digits and no clubbing  NEURO: alert & oriented x 3 with fluent speech, no focal motor/sensory deficits   LABORATORY DATA:  I have reviewed the data as listed   Chemistry      Component Value Date/Time   NA 140 11/23/2014 0941   NA 139 11/09/2014 1056   K 3.8 11/23/2014 0941   K 3.8 11/09/2014 1056   CL 102 11/09/2014 1056   CL 102 01/17/2013 1315   CO2 26 11/23/2014 0941   CO2 28 11/09/2014 1056   BUN 14.9 11/23/2014 0941   BUN 17 11/09/2014 1056   CREATININE 0.8 11/23/2014 0941   CREATININE 0.77 11/09/2014 1056      Component Value Date/Time   CALCIUM 9.5 11/23/2014 0941   CALCIUM 9.5 11/09/2014 1056   ALKPHOS 70 11/23/2014 0941   ALKPHOS 64 11/09/2014 1056   AST 26 11/23/2014 0941   AST 32 11/09/2014 1056   ALT 39 11/23/2014 0941   ALT 47* 11/09/2014 1056   BILITOT 0.59 11/23/2014 0941   BILITOT 0.6 11/09/2014 1056       Lab Results  Component Value Date   WBC 4.4 11/23/2014   HGB 12.6 11/23/2014   HCT 37.7 11/23/2014   MCV 91.7 11/23/2014   PLT 237 11/23/2014   NEUTROABS 2.0 11/23/2014      ASSESSMENT & PLAN:  Breast cancer, left breast 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 Hot flashes: Was on Effexor discontinued due to easy bruising 2. Lymphedema: Patient uses a sleeve We discussed the pros and cons of continuing adjuvant endocrine therapy. Patient would like to consider discontinuation of therapy unless we can prove that she would benefit from longer term therapy. I  recommended doing breast cancer index. We will plan on doing this when she comes back to see [place] in November. She will undergo another mammogram and breast MRI in October or November of this year.  Breast Cancer Surveillance: 1. Breast exam 11/23/2014: Normal 2. Mammogram 05/11/2014 No abnormalities. Postsurgical changes. Breast Density Category C. I recommended that she get 3-D mammograms for surveillance. MRI breast 05/30/2014: 0.8 cm of non-mass enhancement 11:30 position left breast they attempted to MRI guided biopsy but no abnormality was seen biopsy at that time. MRI of the breast could be done November 2016 Discussed the differences between different breast density categories.  Elevated AST ALT/fatty liver: Her liver function tests are normal. Ultrasound of the abdomen revealed fatty liver. No abnormalities noted to suggest recurrent breast cancer are stage IV breast cancer. 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 I discussed with her abdominal MRI is warranted. We decided against doing it.  Patient is under tremendous stress from multiple reasons including the fact that her husband has ALS.  I encouraged her regarding stress relaxing techniques.    Orders Placed This Encounter  Procedures  . MR Breast Bilateral Wo Contrast    Standing Status: Future     Number of Occurrences:      Standing Expiration Date: 11/23/2015    Order Specific Question:  Reason for Exam (SYMPTOM  OR DIAGNOSIS REQUIRED)    Answer:  Annual MRI breast for increased breast density with Left breast cancer history    Order Specific Question:  Preferred imaging location?    Answer:  [facility]    Order Specific Question:  Does the patient have a pacemaker or implanted devices?    Answer:  No    Order Specific Question:  What is the patient's sedation requirement?    Answer:  No Sedation  . MM Digital Diagnostic Bilat    No prob/hx of lt breast cancer lumpectomy/no  implants Pf 05/11/14 bcg/no needs/ins-bcbs/np/pt with epic order    Standing Status: Future     Number of Occurrences:      Standing Expiration Date: 11/23/2015    Order Specific Question:  Reason for Exam (SYMPTOM  OR DIAGNOSIS REQUIRED)    Answer:  Annual MRI breast for increased breast density with Left breast cancer histor    Order Specific Question:  Is the patient pregnant?    Answer:  No    Order Specific Question:  Preferred imaging location?    Answer:  [facility]  . MR Breast Bilateral W Wo Contrast    Wt-158/labs drawn 11-23-14 in epic/not claus/hx of lt breast lumpectomy/no metal/no pacemaker or stents/no implanted devices or brain aneurysm clips/hx of left breast cancer, hx of skin cancer 4 yrs ago/no hx of kidney dz/pt has fatty liver dz/no lupus,ra,or sclero/pt has hpt/no diabetes/baby aspirin daily intake, no nsaids,or blood thinners/nkda to iv/no needs/ins-bcbs/np/pt with epic order    Standing Status: Future     Number of Occurrences:      Standing Expiration Date: 01/23/2016    Order Specific Question:  Reason for Exam (SYMPTOM  OR DIAGNOSIS REQUIRED)    Answer:  Annual MRI breast for increased breast density with Left breast cancer histor    Order Specific Question:  Preferred imaging location?    Answer:  [facility]    Order Specific Question:  Does the patient have a pacemaker or implanted devices?    Answer:  No    Order Specific Question:  What is the patient's sedation requirement?    Answer:  No Sedation   The patient has a good understanding of the overall plan. she agrees with it. She will call with any problems that may develop before her next visit here.   Rulon Eisenmenger, MD

## 2014-12-26 ENCOUNTER — Encounter: Payer: Self-pay | Admitting: Hematology and Oncology

## 2014-12-27 ENCOUNTER — Other Ambulatory Visit: Payer: Self-pay

## 2014-12-27 ENCOUNTER — Other Ambulatory Visit: Payer: Self-pay | Admitting: *Deleted

## 2014-12-27 DIAGNOSIS — Z853 Personal history of malignant neoplasm of breast: Secondary | ICD-10-CM

## 2014-12-27 DIAGNOSIS — C50912 Malignant neoplasm of unspecified site of left female breast: Secondary | ICD-10-CM

## 2014-12-27 MED ORDER — LETROZOLE 2.5 MG PO TABS: 2.5000 mg | ORAL_TABLET | Freq: Every day | ORAL | Status: DC

## 2015-05-15 ENCOUNTER — Ambulatory Visit
Admission: RE | Admit: 2015-05-15 | Discharge: 2015-05-15 | Disposition: A | Discharge status: Home / Self Care | Payer: BC Managed Care – PPO | Source: Ambulatory Visit | Attending: Hematology and Oncology | Admitting: Hematology and Oncology

## 2015-05-15 ENCOUNTER — Other Ambulatory Visit: Payer: Self-pay | Admitting: Hematology and Oncology

## 2015-05-15 DIAGNOSIS — C50912 Malignant neoplasm of unspecified site of left female breast: Secondary | ICD-10-CM

## 2015-05-16 ENCOUNTER — Ambulatory Visit
Admission: RE | Admit: 2015-05-16 | Discharge: 2015-05-16 | Disposition: A | Discharge status: Home / Self Care | Payer: BC Managed Care – PPO | Source: Ambulatory Visit | Attending: Hematology and Oncology | Admitting: Hematology and Oncology

## 2015-05-16 DIAGNOSIS — C50912 Malignant neoplasm of unspecified site of left female breast: Secondary | ICD-10-CM

## 2015-05-16 MED ORDER — GADOBENATE DIMEGLUMINE 529 MG/ML IV SOLN
14.0000 mL | Freq: Once | INTRAVENOUS | Status: AC | PRN
  Administered 2015-05-16: 14 mL via INTRAVENOUS

## 2015-06-05 ENCOUNTER — Ambulatory Visit (INDEPENDENT_AMBULATORY_CARE_PROVIDER_SITE_OTHER): Payer: BC Managed Care – PPO

## 2015-06-05 ENCOUNTER — Encounter: Payer: Self-pay | Admitting: Hematology and Oncology

## 2015-06-05 ENCOUNTER — Ambulatory Visit: Payer: BC Managed Care – PPO

## 2015-06-05 ENCOUNTER — Ambulatory Visit (INDEPENDENT_AMBULATORY_CARE_PROVIDER_SITE_OTHER): Payer: BC Managed Care – PPO | Admitting: Podiatry

## 2015-06-05 ENCOUNTER — Other Ambulatory Visit: Payer: Self-pay | Admitting: *Deleted

## 2015-06-05 ENCOUNTER — Encounter: Payer: Self-pay | Admitting: Podiatry

## 2015-06-05 VITALS — BP 142/94 | HR 79 | Resp 16 | Ht 65.0 in | Wt 155.0 lb

## 2015-06-05 DIAGNOSIS — M2022 Hallux rigidus, left foot: Secondary | ICD-10-CM | POA: Diagnosis not present

## 2015-06-05 DIAGNOSIS — M2011 Hallux valgus (acquired), right foot: Secondary | ICD-10-CM

## 2015-06-05 DIAGNOSIS — M2012 Hallux valgus (acquired), left foot: Secondary | ICD-10-CM

## 2015-06-05 DIAGNOSIS — M2021 Hallux rigidus, right foot: Secondary | ICD-10-CM | POA: Diagnosis not present

## 2015-06-05 NOTE — Progress Notes (Signed)
   Subjective:    Patient ID: Alexa Kelley, female    DOB: May 20, 1961, 54 y.o.   MRN: [id number]  HPI: She presents today as a 54 year old white female with six-month duration of pain first metatarsophalangeal joint of the right foot. States it is becoming more and more painful as time goes on his worsening with shoe gear and activity. She had a bunion repair first metatarsal left foot many years ago. She stating that she has some dorsal lateral pain to the left foot now but it appears to be intermittent. No trauma.    Review of Systems  Musculoskeletal: Positive for arthralgias.  All other systems reviewed and are negative.      Objective:   Physical Exam: 54 year old female in no apparent distress vital signs stable alert and oriented 3. Pulses are strongly palpable. Neurologic sensorium is intact person's once the monofilament. Deep tendon reflexes are intact bilateral muscle strength +5 over 5 dorsiflexors plantar flexors and inverters and everters all intrinsic musculature is intact. Orthopedic evaluation does rates all joints distal to the ankle before range of motion without crepitus with exception of the first metatarsophalangeal joint bilateral. The first metatarsophalangeal joint bilaterally is limited on dorsiflexion with pain. She has an elevated first metatarsal on physical exam as well as seen on radiograph. 3 views radiographs taken in the office bilateral foot does demonstrate screw to the head of the first metatarsal of the left foot. Joint space narrowing and subchondral sclerosis and spurring bilateral first metatarsophalangeal joints with an elevated first metatarsal CONSISTENT with hallux limitus or hallux rigidus first metatarsophalangeal joint bilateral. Cutaneous evaluation demonstrates supple well-hydrated cutis no erythema edema cellulitis drainage or odor.        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  Plan: Discussed etiology pathology conservative versus surgical therapies. At this point I recommended surgical intervention consisting of a Keller arthroplasty with a silicone implant she understands this and is amenable to it and we'll consider having this performed next September. Her biggest dilemma is having family members take care of her husband.

## 2015-06-08 ENCOUNTER — Telehealth: Payer: Self-pay | Admitting: *Deleted

## 2015-06-08 NOTE — Telephone Encounter (Signed)
Left message for a return phone call regarding [facility].  Awaiting patient response.

## 2015-06-26 ENCOUNTER — Encounter: Payer: Self-pay | Admitting: Hematology and Oncology

## 2015-06-26 ENCOUNTER — Telehealth: Payer: Self-pay | Admitting: Hematology and Oncology

## 2015-06-26 ENCOUNTER — Ambulatory Visit (HOSPITAL_BASED_OUTPATIENT_CLINIC_OR_DEPARTMENT_OTHER): Payer: BC Managed Care – PPO | Admitting: Hematology and Oncology

## 2015-06-26 VITALS — BP 129/89 | HR 62 | Temp 97.6°F | Resp 18 | Ht 65.0 in | Wt 160.8 lb

## 2015-06-26 DIAGNOSIS — N289 Disorder of kidney and ureter, unspecified: Secondary | ICD-10-CM

## 2015-06-26 DIAGNOSIS — C50512 Malignant neoplasm of lower-outer quadrant of left female breast: Secondary | ICD-10-CM | POA: Diagnosis not present

## 2015-06-26 DIAGNOSIS — K760 Fatty (change of) liver, not elsewhere classified: Secondary | ICD-10-CM | POA: Diagnosis not present

## 2015-06-26 DIAGNOSIS — Z9221 Personal history of antineoplastic chemotherapy: Secondary | ICD-10-CM

## 2015-06-26 DIAGNOSIS — Z79811 Long term (current) use of aromatase inhibitors: Secondary | ICD-10-CM

## 2015-06-26 DIAGNOSIS — I890 Lymphedema, not elsewhere classified: Secondary | ICD-10-CM | POA: Diagnosis not present

## 2015-06-26 DIAGNOSIS — Z923 Personal history of irradiation: Secondary | ICD-10-CM

## 2015-06-26 NOTE — Addendum Note (Signed)
Addended by: Prentiss Bells on: 06/26/2015 01:26 PM   Modules accepted: Medications

## 2015-06-26 NOTE — Telephone Encounter (Signed)
Gave patient avs report and appointments for November 2017.  °

## 2015-06-26 NOTE — Progress Notes (Signed)
Patient Care Team: Antony Blackbird, MD as PCP - General (Family Medicine)  DIAGNOSIS: No matching staging information was found for the patient.  SUMMARY OF ONCOLOGIC HISTORY:   Breast cancer of lower-outer quadrant of left female breast ([place])   01/30/2009 Initial Diagnosis left breast excisional biopsy on 02/09/2009 which showed a ER positive, PR positive, HER-2/neu negative tumor measuring 1.0 cm   03/16/2009 Surgery stage IIA, T1b N56m+, invasive ductal carcinoma, residual tumor measured 0.4 cm, with no evidence of lymphovascular invasion, surgical margins were free of tumor, with 1/3 positive lymph nodes for micrometastasis (0.5 mm). 0/16 LN   04/10/2009 - 09/03/2009 Chemotherapy ECOG 5103 study and is status post 4 cycles of chemotherapy with dose dense Doxorubicin/Cyclophosphamide followed by 12 cycles of every 3 weeks Taxol/Avastin vs placebo (Patient was on placebo arm)   11/06/2009 - 12/21/2009 Radiation Therapy Adjuvant radiation therapy   11/22/2009 Procedure Genetic testing negative for BRCA1 and BRCA2 gene mutations   12/04/2009 -  Anti-estrogen oral therapy Tamoxifen started May 2011 switched to letrozole June 2012 where she became postmenopausal    CHIEF COMPLIANT: follow-up on letrozole  INTERVAL HISTORY: Alexa Mogais a 54year old with above-mentioned history of left breast cancer treated with the surgery followed by chemotherapy and radiation and is currently on oral antiestrogen therapy with letrozole. She is been on letrozole since June 2012. She reports no major problems or concerns with letrozole. She had a Recent mammogram and breast MRI which were both normal.  REVIEW OF SYSTEMS:   Constitutional: Denies fevers, chills or abnormal weight loss Eyes: Denies blurriness of vision Ears, nose, mouth, throat, and face: Denies mucositis or sore throat Respiratory: Denies cough, dyspnea or wheezes Cardiovascular: Denies palpitation, chest discomfort or lower extremity  swelling Gastrointestinal:  Denies nausea, heartburn or change in bowel habits Skin: Denies abnormal skin rashes Lymphatics: Denies new lymphadenopathy or easy bruising Neurological:Denies numbness, tingling or new weaknesses Behavioral/Psych: Mood is stable, no new changes  Breast:  denies any pain or lumps or nodules in either breasts All other systems were reviewed with the patient and are negative.  I have reviewed the past medical history, past surgical history, social history and family history with the patient and they are unchanged from previous note.  ALLERGIES:  has No Known Allergies.  MEDICATIONS:  Current Outpatient Prescriptions  Medication Sig Dispense Refill  . aspirin 81 MG chewable tablet Chew 81 mg by mouth daily.    . cholecalciferol (VITAMIN D) 1000 UNITS tablet Take 1,000 Units by mouth 2 (two) times daily.    . COCONUT OIL PO Take 1 capsule by mouth every morning.    . fish oil-omega-3 fatty acids 1000 MG capsule Take 2 g by mouth daily.    .Marland Kitchenletrozole (FEMARA) 2.5 MG tablet Take 1 tablet (2.5 mg total) by mouth daily. 90 tablet 3  . LORazepam (ATIVAN) 1 MG tablet 0.5 mg at bedtime.     .Marland Kitchenlosartan (COZAAR) 50 MG tablet Take 50 mg by mouth daily.    . Multiple Vitamins-Minerals (MULTIVITAMIN WITH MINERALS) tablet Take 1 tablet by mouth 2 (two) times a week.    . simvastatin (ZOCOR) 40 MG tablet     . venlafaxine XR (EFFEXOR-XR) 37.5 MG 24 hr capsule Take 1 capsule (37.5 mg total) by mouth daily. 30 capsule 6   No current facility-administered medications for this visit.    PHYSICAL EXAMINATION: ECOG PERFORMANCE STATUS: 1 - Symptomatic but completely ambulatory  Filed Vitals:   06/26/15 0915  BP: 129/89  Pulse: 62  Temp: 97.6 F (36.4 C)  Resp: 18   Filed Weights   06/26/15 0915  Weight: 160 lb 12.8 oz (72.938 kg)    GENERAL:alert, no distress and comfortable SKIN: skin color, texture, turgor are normal, no rashes or significant lesions EYES:  normal, Conjunctiva are pink and non-injected, sclera clear OROPHARYNX:no exudate, no erythema and lips, buccal mucosa, and tongue normal  NECK: supple, thyroid normal size, non-tender, without nodularity LYMPH:  no palpable lymphadenopathy in the cervical, axillary or inguinal LUNGS: clear to auscultation and percussion with normal breathing effort HEART: regular rate & rhythm and no murmurs and no lower extremity edema ABDOMEN:abdomen soft, non-tender and normal bowel sounds Musculoskeletal:no cyanosis of digits and no clubbing  NEURO: alert & oriented x 3 with fluent speech, no focal motor/sensory deficits BREAST: No palpable masses or nodules in either right or left breasts. No palpable axillary supraclavicular or infraclavicular adenopathy no breast tenderness or nipple discharge. (exam performed in the presence of a chaperone)  LABORATORY DATA:  I have reviewed the data as listed   Chemistry      Component Value Date/Time   NA 140 11/23/2014 0941   NA 139 11/09/2014 1056   K 3.8 11/23/2014 0941   K 3.8 11/09/2014 1056   CL 102 11/09/2014 1056   CL 102 01/17/2013 1315   CO2 26 11/23/2014 0941   CO2 28 11/09/2014 1056   BUN 14.9 11/23/2014 0941   BUN 17 11/09/2014 1056   CREATININE 0.8 11/23/2014 0941   CREATININE 0.77 11/09/2014 1056      Component Value Date/Time   CALCIUM 9.5 11/23/2014 0941   CALCIUM 9.5 11/09/2014 1056   ALKPHOS 70 11/23/2014 0941   ALKPHOS 64 11/09/2014 1056   AST 26 11/23/2014 0941   AST 32 11/09/2014 1056   ALT 39 11/23/2014 0941   ALT 47* 11/09/2014 1056   BILITOT 0.59 11/23/2014 0941   BILITOT 0.6 11/09/2014 1056       Lab Results  Component Value Date   WBC 4.4 11/23/2014   HGB 12.6 11/23/2014   HCT 37.7 11/23/2014   MCV 91.7 11/23/2014   PLT 237 11/23/2014   NEUTROABS 2.0 11/23/2014   ASSESSMENT & PLAN:  Breast cancer of lower-outer quadrant of left female breast ([place]) Left breast invasive ductal carcinoma status post lumpectomy  03/17/2009 stage II a, T1b N1 mic M0, no LVI, margins negative, 1/3 sentinel nodes with micrometastases 0.5 mm, extending additional nodes negative, ECOG 5103 study 4 cycles of AC foll by taxol completed 09/02/2009 status post radiation and started antiestrogen therapy May 2011 currently Letrozole 2.5 mg daily since June 2012  Letrozole toxicities: 1. Hot flashes: Was on Effexor discontinued due to easy bruising 2. Lymphedema (unrelated to letrozole): Patient uses a sleeve We decided to perform breast cancer index to determine if she would need to stay on antiestrogen therapy for longer than 5 years. We will obtain this on the surgery material that is in [place].  Breast Cancer Surveillance: 1. Breast exam 06/26/2015: Normal 2. Mammogram 05/14/2014 No abnormalities. Postsurgical changes. Breast Density Category C. I recommended that she get 3-D mammograms for surveillance. 3. Breast MRI 05/16/2015: Breast composition category B no abnormal enhancement in either breast Recommendation is annual mammograms.  Elevated AST ALT/fatty liver: Her liver function tests have been previously normal. Ultrasound of the abdomen revealed fatty liver. No abnormalities noted to suggest recurrent breast cancer are stage IV breast cancer. 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   Patient is under tremendous stress from multiple reasons including the fact that her husband has ALS.  Return to clinic in 1 year for follow-up  No orders of the defined types were placed in this encounter.   The patient has a good understanding of the overall plan. she agrees with it. she will call with any problems that may develop before the next visit here.   Rulon Eisenmenger, MD 06/26/2015

## 2015-06-26 NOTE — Assessment & Plan Note (Signed)
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 Hot flashes: Was on Effexor discontinued due to easy bruising 2. Lymphedema (unrelated to letrozole): Patient uses a sleeve  Breast Cancer Surveillance: 1. Breast exam 06/26/2015: Normal 2. Mammogram 05/14/2014 No abnormalities. Postsurgical changes. Breast Density Category C. I recommended that she get 3-D mammograms for surveillance. 3. Breast MRI 05/16/2015: Breast composition category B no abnormal enhancement in either breast Recommendation is annual mammograms.  Elevated AST ALT/fatty liver: Her liver function tests are currently normal. Ultrasound of the abdomen revealed fatty liver. No abnormalities noted to suggest recurrent breast cancer are stage IV breast cancer. 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 I discussed with her abdominal MRI is warranted. She decided against doing it.  Patient is under tremendous stress from multiple reasons including the fact that her husband has ALS.  Return to clinic in 1 year for follow-up

## 2015-06-27 ENCOUNTER — Telehealth: Payer: Self-pay | Admitting: *Deleted

## 2015-06-27 NOTE — Telephone Encounter (Signed)
Left message for a return phone call concerning BCI.  No records were found in pathology at [facility] from patient's lumpectomy. Awaiting patient response.

## 2015-07-03 ENCOUNTER — Encounter: Payer: Self-pay | Admitting: *Deleted

## 2015-07-03 NOTE — Progress Notes (Signed)
Ordered BCI per Dr. Gudena.  Faxed requisition to [facility]. 

## 2015-08-08 ENCOUNTER — Telehealth: Payer: Self-pay | Admitting: *Deleted

## 2015-08-08 NOTE — Telephone Encounter (Signed)
Received request for updated office notes, labs and radiology reports for [facility] from Olean Ree at [facility].  Faxed requested paperwork to [facility].

## 2015-09-11 ENCOUNTER — Encounter: Payer: Self-pay | Admitting: Hematology and Oncology

## 2015-09-19 ENCOUNTER — Encounter: Payer: Self-pay | Admitting: Hematology and Oncology

## 2016-04-14 ENCOUNTER — Other Ambulatory Visit: Payer: Self-pay | Admitting: Hematology and Oncology

## 2016-04-14 DIAGNOSIS — Z853 Personal history of malignant neoplasm of breast: Secondary | ICD-10-CM

## 2016-05-16 ENCOUNTER — Other Ambulatory Visit: Payer: Self-pay

## 2016-05-16 ENCOUNTER — Other Ambulatory Visit: Payer: Self-pay | Admitting: Hematology and Oncology

## 2016-05-16 DIAGNOSIS — Z853 Personal history of malignant neoplasm of breast: Secondary | ICD-10-CM

## 2016-05-19 ENCOUNTER — Ambulatory Visit
Admission: RE | Admit: 2016-05-19 | Discharge: 2016-05-19 | Disposition: A | Discharge status: Home / Self Care | Payer: BC Managed Care – PPO | Source: Ambulatory Visit | Attending: Hematology and Oncology | Admitting: Hematology and Oncology

## 2016-05-19 DIAGNOSIS — Z853 Personal history of malignant neoplasm of breast: Secondary | ICD-10-CM

## 2016-06-17 IMAGING — US US ABDOMEN COMPLETE
1 series · 13 of 25 positions shown · non-contrast
Comparison: None.

CLINICAL DATA: Right upper quadrant pain and bloating for 1 month.
Personal history of breast carcinoma.

EXAM:
ULTRASOUND ABDOMEN COMPLETE

[Series 1: us abdomen complete · 0.18mm/px · 13 of 108 slices shown]
[im 1/108]
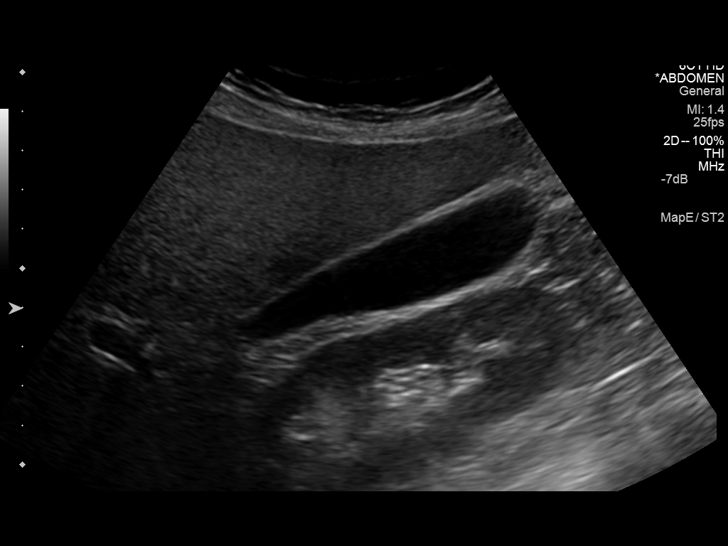
[im 9/108]
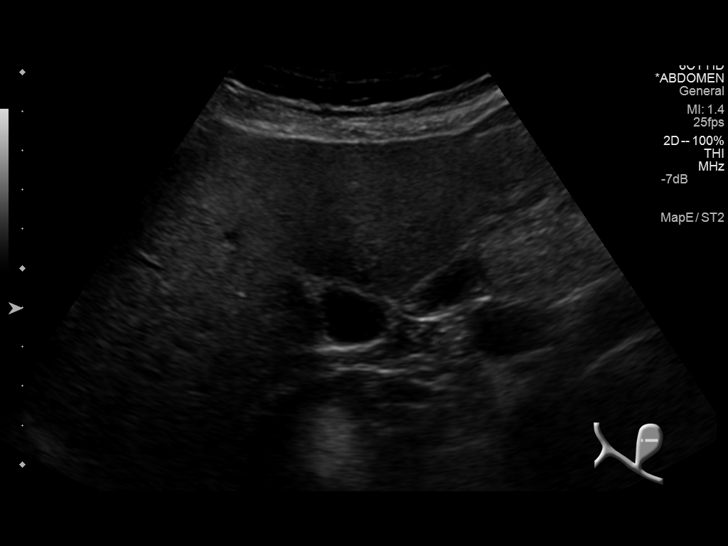
[im 18/108]
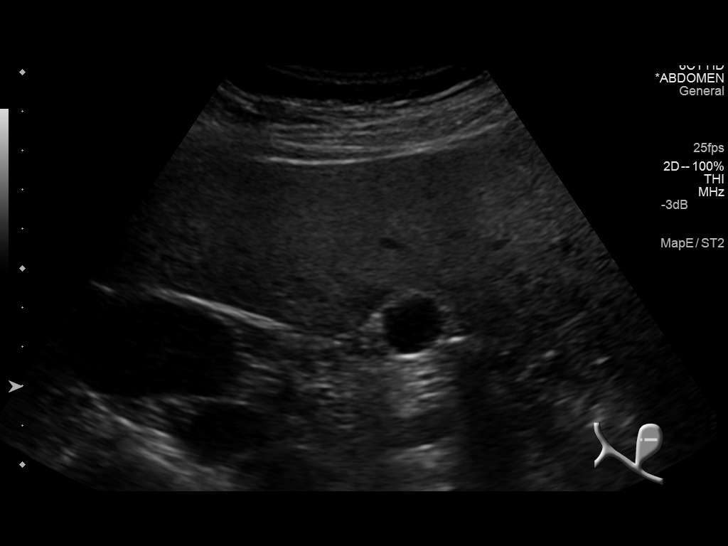
[im 27/108]
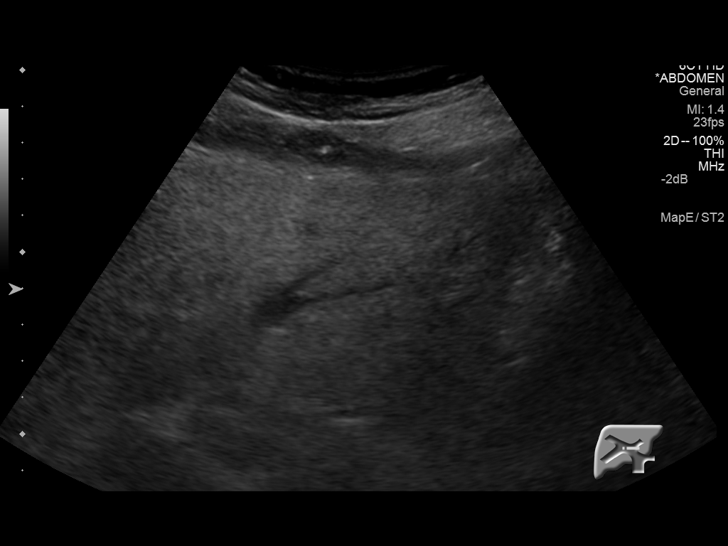
[im 36/108]
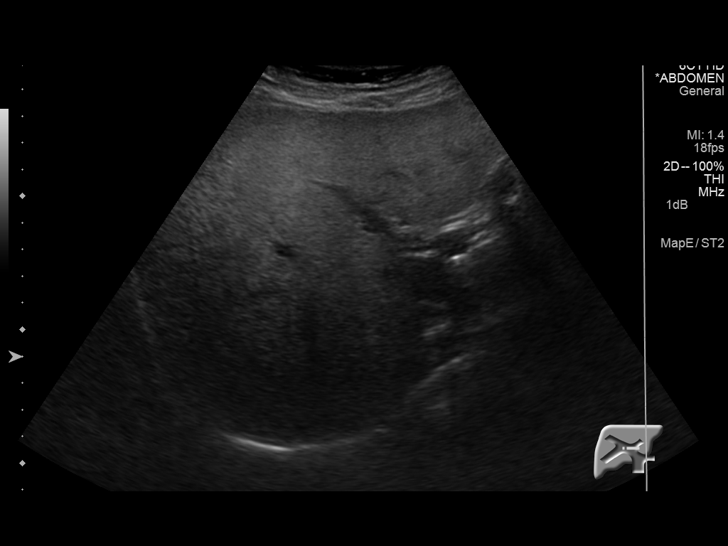
[im 45/108]
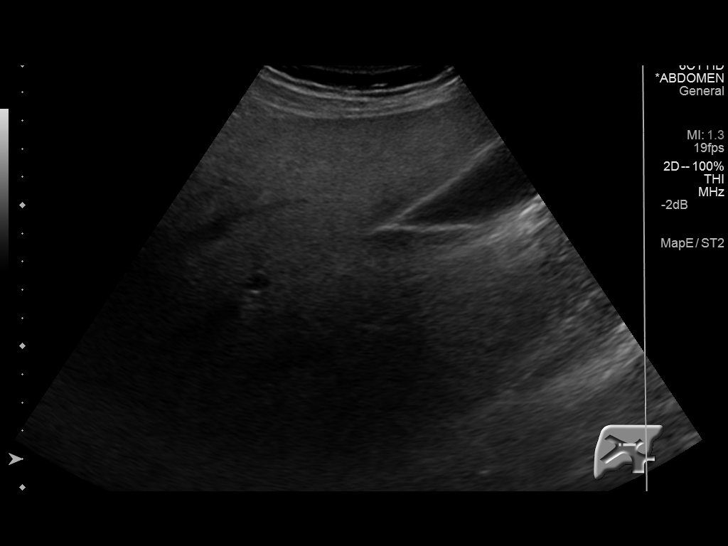
[im 54/108]
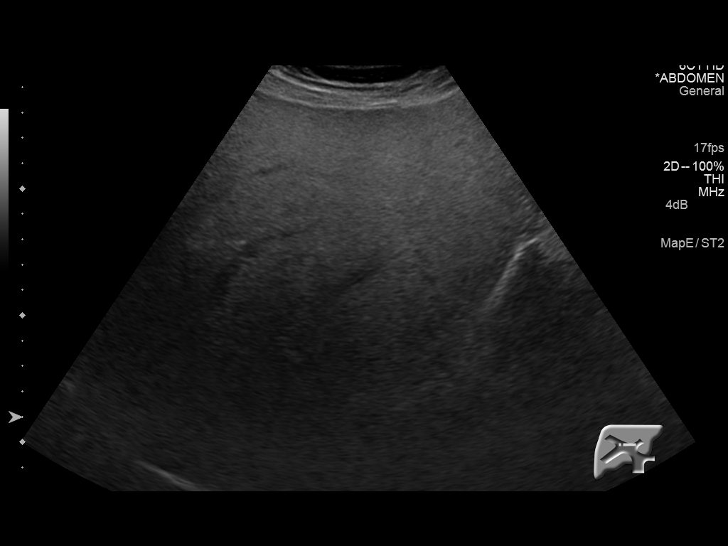
[im 63/108]
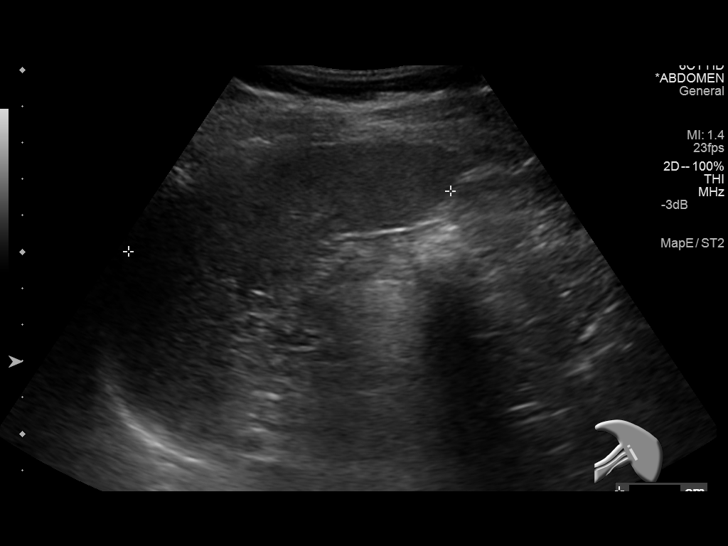
[im 72/108]
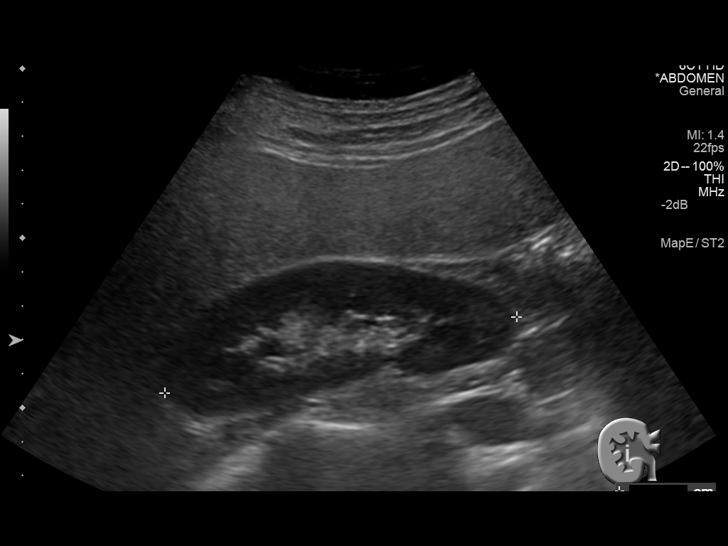
[im 81/108]
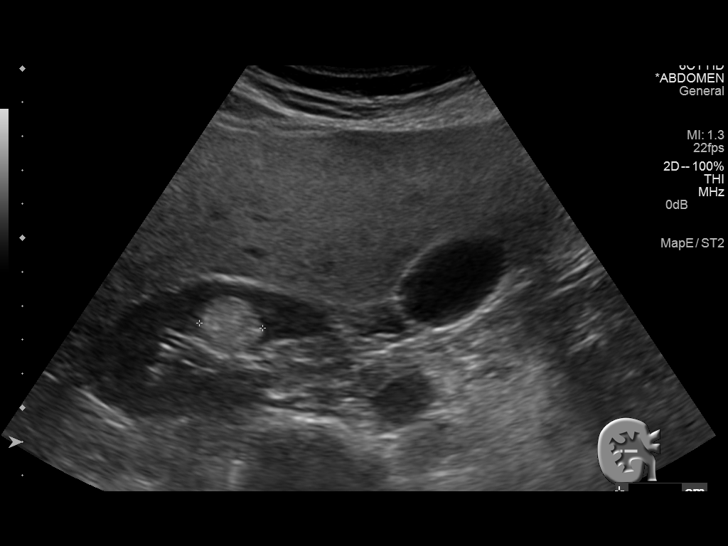
[im 90/108]
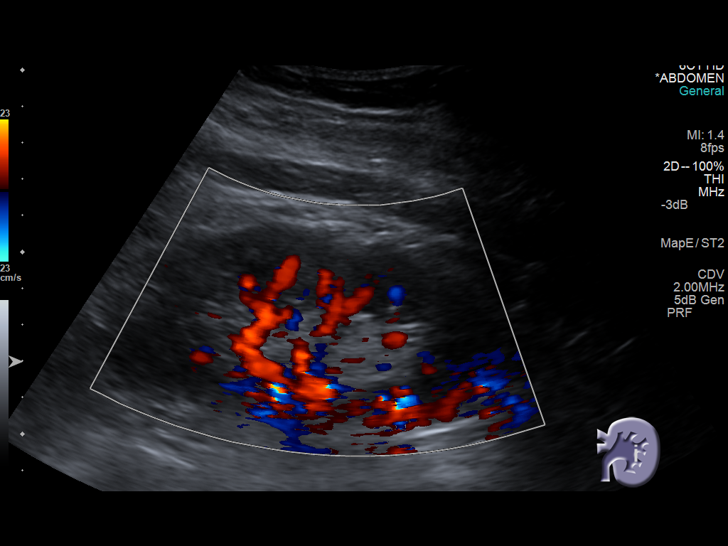
[im 99/108]
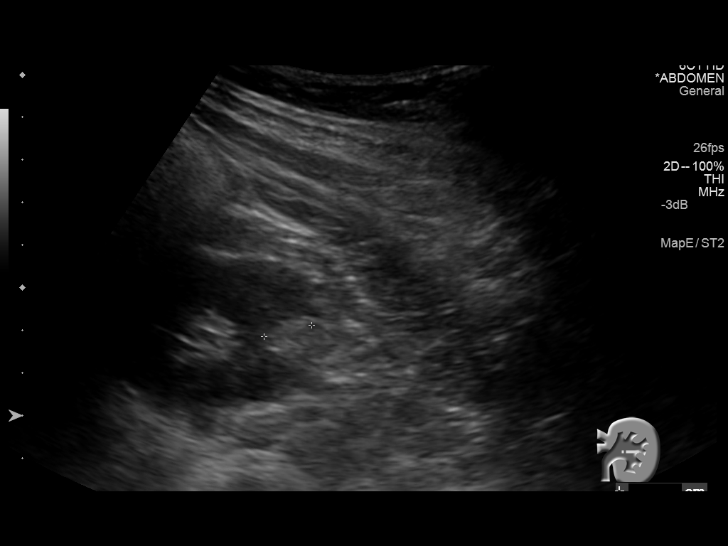
[im 108/108]
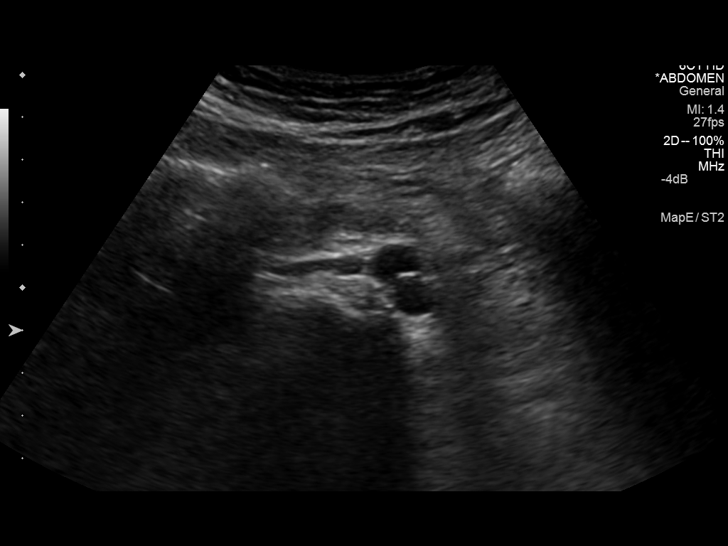

[13 of 25 positions shown; findings below may reference images not displayed]

FINDINGS: Gallbladder: No gallstones or wall thickening visualized. No
sonographic Murphy sign noted.

Common bile duct: Diameter: 4 mm, within normal limits.

Liver: Diffusely increased echogenicity of the hepatic parenchyma,
consistent with diffuse hepatic steatosis/hepatocellular disease.
Focal area of fatty sparing noted adjacent gallbladder fossa,
however no liver masses are visualized.

IVC: No abnormality visualized.

Pancreas: Visualized portion unremarkable.

Spleen: Size and appearance within normal limits.

Right Kidney: Length: 10.6 cm. Echogenicity within normal limits. No
evidence of hydronephrosis. A 1.9 cm hyperechoic mass is seen in the
upper pole, most likely representing a benign angiomyolipoma,
although a small hyperechoic renal cell carcinoma cannot definitely
be excluded.

Left Kidney: Length: 11.2 cm. Echogenicity within normal limits. No
evidence of hydronephrosis. A 1.1 cm hyperechoic lesion is seen in
the lower pole this also likely represents a benign angiomyolipoma,
although a small hyperechoic renal cell carcinoma cannot definitely
be excluded.

Abdominal aorta: No aneurysm visualized.

Other findings: None.
IMPRESSION: No evidence of gallstones or biliary dilatation.

Diffusely increased echogenicity of the hepatic parenchyma,
consistent with diffuse hepatic steatosis/hepatocellular disease. No
focal mass lesion identified.

Small bilateral hyperechoic renal masses. These most likely
represent benign angiomyolipomas, although small hyperechoic renal
cell carcinomas cannot definitely be excluded. Abdomen MRI without
and with contrast is recommended for further characterization.

## 2016-06-23 NOTE — Progress Notes (Signed)
Patient Care Team: Antony Blackbird, MD as PCP - General (Family Medicine)  DIAGNOSIS:  Encounter Diagnosis  Name Primary?  . Malignant neoplasm of lower-outer quadrant of left breast of female, estrogen receptor positive ([place])     SUMMARY OF ONCOLOGIC HISTORY:   Breast cancer of lower-outer quadrant of left female breast ([place])   01/30/2009 Initial Diagnosis    left breast excisional biopsy on 02/09/2009 which showed a ER positive, PR positive, HER-2/neu negative tumor measuring 1.0 cm      03/16/2009 Surgery    stage IIA, T1b N28m+, invasive ductal carcinoma, residual tumor measured 0.4 cm, with no evidence of lymphovascular invasion, surgical margins were free of tumor, with 1/3 positive lymph nodes for micrometastasis (0.5 mm). 0/16 LN      04/10/2009 - 09/03/2009 Chemotherapy    ECOG 5103 study and is status post 4 cycles of chemotherapy with dose dense Doxorubicin/Cyclophosphamide followed by 12 cycles of every 3 weeks Taxol/Avastin vs placebo (Patient was on placebo arm)      11/06/2009 - 12/21/2009 Radiation Therapy    Adjuvant radiation therapy      11/22/2009 Procedure    Genetic testing negative for BRCA1 and BRCA2 gene mutations      12/04/2009 -  Anti-estrogen oral therapy    Tamoxifen started May 2011 switched to letrozole June 2012 where she became postmenopausal       CHIEF COMPLIANT: follow up on Letrozole  INTERVAL HISTORY: Alexa Kelley a 56yr old with Left breast cancer who underwent lumpectomy and adj chemo, XRT and Tamoxifen which was changed to letrozole. Shes tolerating it very well. She denies any lumps or nodules in the breasts.  REVIEW OF SYSTEMS:   Constitutional: Denies fevers, chills or abnormal weight loss Eyes: Denies blurriness of vision Ears, nose, mouth, throat, and face: Denies mucositis or sore throat Respiratory: Denies cough, dyspnea or wheezes Cardiovascular: Denies palpitation, chest discomfort Gastrointestinal:  Denies nausea,  heartburn or change in bowel habits Skin: Denies abnormal skin rashes Lymphatics: Denies new lymphadenopathy or easy bruising Neurological:Denies numbness, tingling or new weaknesses Behavioral/Psych: Mood is stable, no new changes  Extremities: No lower extremity edema Breast: denies any pain or lumps or nodules in either breasts All other systems were reviewed with the patient and are negative.  I have reviewed the past medical history, past surgical history, social history and family history with the patient and they are unchanged from previous note.  ALLERGIES:  has No Known Allergies.  MEDICATIONS:  Current Outpatient Prescriptions  Medication Sig Dispense Refill  . aspirin 81 MG chewable tablet Chew 81 mg by mouth daily.    . cholecalciferol (VITAMIN D) 1000 UNITS tablet Take 1,000 Units by mouth 2 (two) times daily.    . COCONUT OIL PO Take 1 capsule by mouth every morning.    . fish oil-omega-3 fatty acids 1000 MG capsule Take 2 g by mouth daily.    .Marland Kitchenletrozole (FEMARA) 2.5 MG tablet Take 1 tablet (2.5 mg total) by mouth daily. 90 tablet 3  . LORazepam (ATIVAN) 1 MG tablet 0.5 mg at bedtime.     .Marland Kitchenlosartan (COZAAR) 50 MG tablet Take 50 mg by mouth daily.    . Multiple Vitamins-Minerals (MULTIVITAMIN WITH MINERALS) tablet Take 1 tablet by mouth 2 (two) times a week.    . simvastatin (ZOCOR) 40 MG tablet     . venlafaxine XR (EFFEXOR-XR) 37.5 MG 24 hr capsule Take 1 capsule (37.5 mg total) by mouth daily. 3[place]  capsule 6   No current facility-administered medications for this visit.     PHYSICAL EXAMINATION: ECOG PERFORMANCE STATUS: 1 - Symptomatic but completely ambulatory  There were no vitals filed for this visit. There were no vitals filed for this visit.  GENERAL:alert, no distress and comfortable SKIN: skin color, texture, turgor are normal, no rashes or significant lesions EYES: normal, Conjunctiva are pink and non-injected, sclera clear OROPHARYNX:no exudate, no  erythema and lips, buccal mucosa, and tongue normal  NECK: supple, thyroid normal size, non-tender, without nodularity LYMPH:  no palpable lymphadenopathy in the cervical, axillary or inguinal LUNGS: clear to auscultation and percussion with normal breathing effort HEART: regular rate & rhythm and no murmurs and no lower extremity edema ABDOMEN:abdomen soft, non-tender and normal bowel sounds MUSCULOSKELETAL:no cyanosis of digits and no clubbing  NEURO: alert & oriented x 3 with fluent speech, no focal motor/sensory deficits EXTREMITIES: No lower extremity edema BREAST: No palpable masses or nodules in either right or left breasts. No palpable axillary supraclavicular or infraclavicular adenopathy no breast tenderness or nipple discharge. (exam performed in the presence of a chaperone)  LABORATORY DATA:  I have reviewed the data as listed   Chemistry      Component Value Date/Time   NA 140 11/23/2014 0941   K 3.8 11/23/2014 0941   CL 102 11/09/2014 1056   CL 102 01/17/2013 1315   CO2 26 11/23/2014 0941   BUN 14.9 11/23/2014 0941   CREATININE 0.8 11/23/2014 0941      Component Value Date/Time   CALCIUM 9.5 11/23/2014 0941   ALKPHOS 70 11/23/2014 0941   AST 26 11/23/2014 0941   ALT 39 11/23/2014 0941   BILITOT 0.59 11/23/2014 0941       Lab Results  Component Value Date   WBC 4.4 11/23/2014   HGB 12.6 11/23/2014   HCT 37.7 11/23/2014   MCV 91.7 11/23/2014   PLT 237 11/23/2014   NEUTROABS 2.0 11/23/2014    ASSESSMENT & PLAN:  Breast cancer of lower-outer quadrant of left female breast ([place]) Left breast invasive ductal carcinoma status post lumpectomy 03/17/2009 stage II a, T1b N1 mic M0, no LVI, margins negative, 1/3 sentinel nodes with micrometastases 0.5 mm, extending additional nodes negative, ECOG 5103 study 4 cycles of AC foll by taxol completed 09/02/2009 status post radiation and started antiestrogen therapy May 2011 currently Letrozole 2.5 mg daily since June 2012  Completed June 2017  Breast cancer index revealed that she has low risk of recurrence of 1.3% and low likelihood of benefit from extended adjuvant therapy. Because of this and recommended that she discontinue extended adjuvant therapy.  Breast Cancer Surveillance: 1. Breast exam 06/25/2016: Normal 2. Mammogram 05/19/2016 No abnormalities. Postsurgical changes. Breast Density Category C. I recommended that she get 3-D mammograms for surveillance. 3. Breast MRI 05/16/2015: Breast composition category B no abnormal enhancement in either breast Recommendation is annual mammograms.  Elevated AST ALT/fatty liver: Her liver function tests have been previously normal. Ultrasound of the abdomen revealed fatty liver. No abnormalities noted to suggest recurrent breast cancer are stage IV breast cancer. 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   Patient is under tremendous stress from multiple reasons including the fact that her husband has ALS.  Return to clinic in 1 year for follow-up with survivorship clinic.  No orders of the defined types were placed in this encounter.  The patient has a good understanding of the overall plan. she agrees with it. she  will call with any problems that may develop before the next visit here.   Rulon Eisenmenger, MD 06/23/16

## 2016-06-23 NOTE — Assessment & Plan Note (Signed)
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 Hot flashes: Was on Effexor discontinued due to easy bruising 2. Lymphedema (unrelated to letrozole): Patient uses a sleeve We decided to perform breast cancer index to determine if she would need to stay on antiestrogen therapy for longer than 5 years. We will obtain this on the surgery material that is in [place].  Breast Cancer Surveillance: 1. Breast exam 06/25/2016: Normal 2. Mammogram 05/19/2016 No abnormalities. Postsurgical changes. Breast Density Category C. I recommended that she get 3-D mammograms for surveillance. 3. Breast MRI 05/16/2015: Breast composition category B no abnormal enhancement in either breast Recommendation is annual mammograms.  Elevated AST ALT/fatty liver: Her liver function tests have been previously normal. Ultrasound of the abdomen revealed fatty liver. No abnormalities noted to suggest recurrent breast cancer are stage IV breast cancer. 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   Patient is under tremendous stress from multiple reasons including the fact that her husband has ALS.  Return to clinic in 1 year for follow-up

## 2016-06-24 ENCOUNTER — Ambulatory Visit (HOSPITAL_BASED_OUTPATIENT_CLINIC_OR_DEPARTMENT_OTHER): Payer: BC Managed Care – PPO | Admitting: Hematology and Oncology

## 2016-06-24 ENCOUNTER — Encounter: Payer: Self-pay | Admitting: Hematology and Oncology

## 2016-06-24 DIAGNOSIS — Z79811 Long term (current) use of aromatase inhibitors: Secondary | ICD-10-CM | POA: Diagnosis not present

## 2016-06-24 DIAGNOSIS — C50512 Malignant neoplasm of lower-outer quadrant of left female breast: Secondary | ICD-10-CM

## 2016-06-24 DIAGNOSIS — Z170 Estrogen receptor positive status [ER+]: Secondary | ICD-10-CM

## 2017-01-02 DIAGNOSIS — F339 Major depressive disorder, recurrent, unspecified: Secondary | ICD-10-CM | POA: Insufficient documentation

## 2017-01-02 DIAGNOSIS — E782 Mixed hyperlipidemia: Secondary | ICD-10-CM | POA: Insufficient documentation

## 2017-01-02 DIAGNOSIS — I10 Essential (primary) hypertension: Secondary | ICD-10-CM | POA: Insufficient documentation

## 2017-01-02 DIAGNOSIS — R7301 Impaired fasting glucose: Secondary | ICD-10-CM | POA: Insufficient documentation

## 2017-01-02 DIAGNOSIS — Z853 Personal history of malignant neoplasm of breast: Secondary | ICD-10-CM | POA: Insufficient documentation

## 2017-02-27 ENCOUNTER — Other Ambulatory Visit: Payer: Self-pay | Admitting: Hematology and Oncology

## 2017-02-27 DIAGNOSIS — Z853 Personal history of malignant neoplasm of breast: Secondary | ICD-10-CM

## 2017-05-21 ENCOUNTER — Ambulatory Visit
Admission: RE | Admit: 2017-05-21 | Discharge: 2017-05-21 | Disposition: A | Discharge status: Home / Self Care | Payer: BC Managed Care – PPO | Source: Ambulatory Visit | Attending: Hematology and Oncology | Admitting: Hematology and Oncology

## 2017-05-21 DIAGNOSIS — Z853 Personal history of malignant neoplasm of breast: Secondary | ICD-10-CM

## 2017-05-21 HISTORY — DX: Personal history of irradiation: Z92.3

## 2017-06-15 NOTE — Progress Notes (Signed)
CLINIC:  Survivorship   REASON FOR VISIT:  Routine follow-up for history of breast cancer.   BRIEF ONCOLOGIC HISTORY:    Breast cancer of lower-outer quadrant of left female breast ([place])   01/30/2009 Initial Diagnosis    left breast excisional biopsy on 02/09/2009 which showed a ER positive, PR positive, HER-2/neu negative tumor measuring 1.0 cm      03/16/2009 Surgery    stage IIA, T1b N12m+, invasive ductal carcinoma, residual tumor measured 0.4 cm, with no evidence of lymphovascular invasion, surgical margins were free of tumor, with 1/3 positive lymph nodes for micrometastasis (0.5 mm). 0/16 LN      04/10/2009 - 09/03/2009 Chemotherapy    ECOG 5103 study and is status post 4 cycles of chemotherapy with dose dense Doxorubicin/Cyclophosphamide followed by 12 cycles of every 3 weeks Taxol/Avastin vs placebo (Patient was on placebo arm)      11/06/2009 - 12/21/2009 Radiation Therapy    Adjuvant radiation therapy      11/22/2009 Procedure    Genetic testing negative for BRCA1 and BRCA2 gene mutations      12/04/2009 -  Anti-estrogen oral therapy    Tamoxifen started May 2011 switched to letrozole June 2012 where she became postmenopausal        INTERVAL HISTORY:  [facility] of breast cancer.  Overall, she reports feeling quite well.   Breast cancer index revealed that she has low risk of recurrence of 1.3% and low likelihood of benefit from extended adjuvant therapy. She stopped the letrozole and hasn't really noted any difference in feeling better or worse since stopping it.  She notes that she takes care of her husband who has ALS and struggles with helping him, and is currently dealing with caregiver burnout.  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      REVIEW OF SYSTEMS:  Review of Systems  Constitutional: Negative for appetite change, chills,  fatigue, fever and unexpected weight change.  HENT:   Negative for hearing loss and lump/mass.   Eyes: Negative for eye problems and icterus.  Respiratory: Negative for chest tightness, cough and shortness of breath.   Cardiovascular: Negative for chest pain, leg swelling and palpitations.  Gastrointestinal: Negative for abdominal distention, abdominal pain, constipation, diarrhea, nausea and vomiting.  Endocrine: Negative for hot flashes.  Genitourinary: Negative for difficulty urinating.   Musculoskeletal: Negative for arthralgias.  Skin: Negative for itching and rash.  Neurological: Negative for dizziness, extremity weakness, headaches and numbness.  Hematological: Negative for adenopathy. Does not bruise/bleed easily.  Psychiatric/Behavioral: Negative for depression. The patient is not nervous/anxious.   Breast: Denies any new nodularity, masses, tenderness, nipple changes, or nipple discharge.       PAST MEDICAL/SURGICAL HISTORY:  Past Medical History:  Diagnosis Date  . [place]   . Hyperlipidemia   . Personal history of radiation therapy   . Skin cancer    Past Surgical History:  Procedure Laterality Date  . BREAST LUMPECTOMY  2010   left breast   . BUNIONECTOMY Left   . POLYPECTOMY       ALLERGIES:  Allergies  Allergen Reactions  . Codeine Nausea And Vomiting     CURRENT MEDICATIONS:  Outpatient Encounter Medications as of 06/16/2017  Medication Sig Note  . aspirin 81 MG chewable tablet Chew 81 mg by mouth daily.   . cholecalciferol (VITAMIN D)  1000 UNITS tablet Take 1,000 Units by mouth 2 (two) times daily.   . COCONUT OIL PO Take 1 capsule by mouth every morning.   . fish oil-omega-3 fatty acids 1000 MG capsule Take 2 g by mouth daily.   Marland Kitchen LORazepam (ATIVAN) 1 MG tablet 0.5 mg at bedtime.    . Multiple Vitamins-Minerals (MULTIVITAMIN WITH MINERALS) tablet Take 1 tablet by mouth 2 (two) times a week.   . propranolol ER (INDERAL LA) 80 MG 24 hr  capsule Take 1 capsule by mouth 2 (two) times daily.   . simvastatin (ZOCOR) 40 MG tablet    . venlafaxine XR (EFFEXOR-XR) 37.5 MG 24 hr capsule Take 1 capsule (37.5 mg total) by mouth daily.   . vitamin C (ASCORBIC ACID) 500 MG tablet Take 500 mg by mouth daily.   . vitamin E 400 UNIT capsule Take 400 Units by mouth daily.   Marland Kitchen letrozole (FEMARA) 2.5 MG tablet Take 1 tablet (2.5 mg total) by mouth daily. (Patient not taking: Reported on 06/24/2016) 06/16/2017: Stopped taking Summer 2017 Smith,Kimberly N, RN 06/16/17 2:50 PM      . [DISCONTINUED] losartan (COZAAR) 50 MG tablet Take 50 mg by mouth daily.   . [DISCONTINUED] TURMERIC PO Take by mouth.    No facility-administered encounter medications on file as of 06/16/2017.      ONCOLOGIC FAMILY HISTORY:  Family History  Problem Relation Age of Onset  . Cancer Mother        Breast cancer  . Thrombosis Mother        Brain clot  . Breast cancer Mother   . Cancer Cousin        Breast cancer  . Hypertension Father   . Cancer Brother        Basal cell carcinoma  . Other Daughter        Alagille syndrome  . ALS Other        Husband    GENETIC COUNSELING/TESTING: Negative in 2011  SOCIAL HISTORY:  Xareni Kelch is married and lives with her husband in [place], [place].  She has 2 children.  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  She denies any current or history of tobacco, alcohol, or illicit drug use.     PHYSICAL EXAMINATION:  Vital Signs: Vitals:   06/16/17 1439  BP: 134/85  Pulse: (!) 54  Resp: 18  Temp: 98 F (36.7 C)  SpO2: 98%   Filed Weights   06/16/17 1439  Weight: 154 lb 11.2 oz (70.2 kg)   General: Well-nourished, well-appearing female in no acute distress.  Unaccompanied today.   HEENT: Head is normocephalic.  Pupils equal and reactive to light. Conjunctivae clear without exudate.  Sclerae anicteric. Oral mucosa is pink, moist.  Oropharynx is pink without lesions or erythema.    Lymph: No cervical, supraclavicular, or infraclavicular lymphadenopathy noted on palpation.  Cardiovascular: Regular rate and rhythm.Marland Kitchen Respiratory: Clear to auscultation bilaterally. Chest expansion symmetric; breathing non-labored.  Breast Exam:  -Left breast: No appreciable masses on palpation. No skin redness, thickening, or peau d'orange appearance; no nipple retraction or nipple discharge; mild distortion in symmetry at previous lumpectomy site well healed scar without erythema or nodularity.  -Right breast: No appreciable masses on palpation. No skin redness, thickening, or peau d'orange appearance; no nipple retraction or nipple discharge;  -Axilla: No axillary adenopathy bilaterally.  GI: Abdomen soft and round; non-tender, non-distended. Bowel sounds normoactive. No hepatosplenomegaly.   GU: Deferred.  Neuro: No  focal deficits. Steady gait.  Psych: Mood and affect normal and appropriate for situation.  MSK: No focal spinal tenderness to palpation, full range of motion in bilateral upper extremities Extremities: No edema. Skin: Warm and dry.  LABORATORY DATA:  None for this visit   DIAGNOSTIC IMAGING:  Most recent mammogram:      ASSESSMENT AND PLAN:  Ms.. Kelley is a pleasant 56 y.o. female with history of Stage IIB left breast invasive ductal carcinoma, ER+/PR+/HER2-, diagnosed in 01/2009, treated with lumpectomy, adjuvant chemotherapy, adjuvant radiation therapy, and anti-estrogen therapy with Tamoxifen followed by Letrozole x 6 years completing therapy in the summer of 2017.  She presents to the [facility] for surveillance and routine follow-up.   1. History of breast cancer:  Ms. Moustafa is currently clinically and radiographically without evidence of disease or recurrence of breast cancer. She will be due for mammogram in 04/2018; orders placed today.  I encouraged her to call me with any questions or concerns before her next visit at the cancer center, and I would  be happy to see her sooner, if needed.    2. Bone health:  Given Ms. Quinto's age, history of breast cancer, and her previous anti-estrogen therapy with Letrozole, she is at risk for bone demineralization. Her last DEXA scan was in 2015 and was normal.  As she is no longer taking an aromatase inhibitor, I will defer to her PCP regarding future bone density testing and management.  She was given education on specific food and activities to promote bone health.  3. Cancer screening:  Due to Ms. Crisanti's history and her age, she should receive screening for skin cancers, colon cancer, and gynecologic cancers. She was encouraged to follow-up with her PCP for appropriate cancer screenings.   4. Health maintenance and wellness promotion: Ms. Diloreto was encouraged to consume 5-7 servings of fruits and vegetables per day. She was also encouraged to engage in moderate to vigorous exercise for 30 minutes per day most days of the week. She was instructed to limit her alcohol consumption and continue to abstain from tobacco use.    Dispo:  -Return to cancer center in one year for LTS follow up -Mammogram in 04/2018   A total of (30) minutes of face-to-face time was spent with this patient with greater than 50% of that time in counseling and care-coordination.   Gardenia Phlegm, NP [facility] [place] [phone]   Note: PRIMARY CARE PROVIDER Antony Blackbird, MD (Inactive) None None

## 2017-06-16 ENCOUNTER — Telehealth: Payer: Self-pay | Admitting: Adult Health

## 2017-06-16 ENCOUNTER — Other Ambulatory Visit: Payer: Self-pay

## 2017-06-16 ENCOUNTER — Ambulatory Visit (HOSPITAL_BASED_OUTPATIENT_CLINIC_OR_DEPARTMENT_OTHER): Payer: BC Managed Care – PPO | Admitting: Adult Health

## 2017-06-16 ENCOUNTER — Encounter: Payer: Self-pay | Admitting: Hematology and Oncology

## 2017-06-16 ENCOUNTER — Encounter: Payer: Self-pay | Admitting: Adult Health

## 2017-06-16 VITALS — BP 134/85 | HR 54 | Temp 98.0°F | Resp 18 | Ht 65.0 in | Wt 154.7 lb

## 2017-06-16 DIAGNOSIS — C50512 Malignant neoplasm of lower-outer quadrant of left female breast: Secondary | ICD-10-CM

## 2017-06-16 DIAGNOSIS — Z853 Personal history of malignant neoplasm of breast: Secondary | ICD-10-CM

## 2017-06-16 DIAGNOSIS — Z170 Estrogen receptor positive status [ER+]: Principal | ICD-10-CM

## 2017-06-16 NOTE — Telephone Encounter (Signed)
Scheduled appt per 11/20 los. Patient did not want avs or calendar.

## 2017-06-16 NOTE — Progress Notes (Signed)
Request for office notes from [facility] @ [place], faxed to [phone], confirmation received

## 2017-06-25 ENCOUNTER — Encounter: Payer: BC Managed Care – PPO | Admitting: Adult Health

## 2017-06-30 ENCOUNTER — Other Ambulatory Visit: Payer: Self-pay | Admitting: Adult Health

## 2017-08-14 ENCOUNTER — Telehealth: Payer: Self-pay | Admitting: Hematology and Oncology

## 2017-08-14 NOTE — Telephone Encounter (Signed)
Returned phone call and left voicemail with rescheduled appts.

## 2017-08-17 ENCOUNTER — Encounter: Payer: BC Managed Care – PPO | Admitting: Genetic Counselor

## 2017-08-17 ENCOUNTER — Other Ambulatory Visit: Payer: BC Managed Care – PPO

## 2018-03-12 ENCOUNTER — Other Ambulatory Visit: Payer: Self-pay | Admitting: Adult Health

## 2018-03-12 DIAGNOSIS — Z1231 Encounter for screening mammogram for malignant neoplasm of breast: Secondary | ICD-10-CM

## 2018-04-19 ENCOUNTER — Telehealth: Payer: Self-pay | Admitting: Adult Health

## 2018-04-19 ENCOUNTER — Inpatient Hospital Stay: Payer: BC Managed Care – PPO | Admitting: Genetic Counselor

## 2018-04-19 ENCOUNTER — Inpatient Hospital Stay: Payer: BC Managed Care – PPO

## 2018-04-19 NOTE — Telephone Encounter (Signed)
Pt called to cancel appts. Will call to r/s

## 2018-05-24 ENCOUNTER — Ambulatory Visit
Admission: RE | Admit: 2018-05-24 | Discharge: 2018-05-24 | Disposition: A | Discharge status: Home / Self Care | Payer: BC Managed Care – PPO | Source: Ambulatory Visit | Attending: Adult Health | Admitting: Adult Health

## 2018-05-24 DIAGNOSIS — Z1231 Encounter for screening mammogram for malignant neoplasm of breast: Secondary | ICD-10-CM

## 2018-05-26 ENCOUNTER — Encounter: Payer: Self-pay | Admitting: Adult Health

## 2018-05-26 NOTE — Progress Notes (Signed)
Faxed office notes to Olean Ree from [facility], [place] @ [phone], confirmation received

## 2018-06-14 ENCOUNTER — Inpatient Hospital Stay: Payer: BC Managed Care – PPO | Attending: Adult Health | Admitting: Adult Health

## 2018-06-14 ENCOUNTER — Encounter: Payer: Self-pay | Admitting: Adult Health

## 2018-06-14 ENCOUNTER — Inpatient Hospital Stay: Payer: BC Managed Care – PPO | Admitting: Adult Health

## 2018-06-14 VITALS — BP 128/89 | HR 60 | Temp 97.8°F | Resp 19 | Ht 65.0 in | Wt 162.0 lb

## 2018-06-14 DIAGNOSIS — Z853 Personal history of malignant neoplasm of breast: Secondary | ICD-10-CM

## 2018-06-14 DIAGNOSIS — Z170 Estrogen receptor positive status [ER+]: Secondary | ICD-10-CM

## 2018-06-14 DIAGNOSIS — C50512 Malignant neoplasm of lower-outer quadrant of left female breast: Secondary | ICD-10-CM

## 2018-06-14 NOTE — Progress Notes (Signed)
CLINIC:  Survivorship   REASON FOR VISIT:  Routine follow-up for history of breast cancer.   BRIEF ONCOLOGIC HISTORY:    Breast cancer of lower-outer quadrant of left female breast ([place])   01/30/2009 Initial Diagnosis    left breast excisional biopsy on 02/09/2009 which showed a ER positive, PR positive, HER-2/neu negative tumor measuring 1.0 cm    03/16/2009 Surgery    stage IIA, T1b N79m+, invasive ductal carcinoma, residual tumor measured 0.4 cm, with no evidence of lymphovascular invasion, surgical margins were free of tumor, with 1/3 positive lymph nodes for micrometastasis (0.5 mm). 0/16 LN    04/10/2009 - 09/03/2009 Chemotherapy    ECOG 5103 study and is status post 4 cycles of chemotherapy with dose dense Doxorubicin/Cyclophosphamide followed by 12 cycles of every 3 weeks Taxol/Avastin vs placebo (Patient was on placebo arm)    11/06/2009 - 12/21/2009 Radiation Therapy    Adjuvant radiation therapy    11/22/2009 Procedure    Genetic testing negative for BRCA1 and BRCA2 gene mutations    12/04/2009 - 02/2016 Anti-estrogen oral therapy    Tamoxifen started May 2011 switched to letrozole June 2012 where she became postmenopausal      INTERVAL HISTORY:  [facility] for her [facility] of breast cancer.  Overall, she reports feeling quite well. [place] so she is adjusting to living alone and no longer being his primary caregiver.  She is planning a tropical vacation in May of next year.  Otherwise everything in her life is stable.      REVIEW OF SYSTEMS:  Review of Systems  Constitutional: Negative for appetite change, chills, fatigue and unexpected weight change.  HENT:   Negative for hearing loss, lump/mass and trouble swallowing.   Eyes: Negative for eye problems and icterus.  Respiratory: Negative for chest tightness, cough and shortness of breath.   Cardiovascular: Negative for  chest pain, leg swelling and palpitations.  Gastrointestinal: Negative for abdominal distention, abdominal pain, constipation, diarrhea, nausea and vomiting.  Endocrine: Negative for hot flashes.  Skin: Negative for itching and rash.  Neurological: Negative for dizziness, extremity weakness, headaches and numbness.  Hematological: Negative for adenopathy. Does not bruise/bleed easily.  Psychiatric/Behavioral: Negative for depression. The patient is not nervous/anxious.    Breast: Denies any new nodularity, masses, tenderness, nipple changes, or nipple discharge.       PAST MEDICAL/SURGICAL HISTORY:  Past Medical History:  Diagnosis Date  . Breast cancer ([place]   . Hyperlipidemia   . Personal history of radiation therapy   . Skin cancer    Past Surgical History:  Procedure Laterality Date  . BREAST LUMPECTOMY Left 2010   left breast   . BUNIONECTOMY Left   . POLYPECTOMY       ALLERGIES:  Allergies  Allergen Reactions  . Codeine Nausea And Vomiting     CURRENT MEDICATIONS:  Outpatient Encounter Medications as of 06/14/2018  Medication Sig  . aspirin 81 MG chewable tablet Chew 81 mg by mouth daily.  . cholecalciferol (VITAMIN D) 1000 UNITS tablet Take 1,000 Units by mouth 2 (two) times daily.  . COCONUT OIL PO Take 1 capsule by mouth every morning.  . fish oil-omega-3 fatty acids 1000 MG capsule Take 2 g by mouth daily.  .Marland Kitchenlisinopril (PRINIVIL,ZESTRIL) 40 MG tablet Take 40 mg by mouth daily.   .Marland KitchenLORazepam (ATIVAN) 1 MG tablet 0.5 mg at bedtime.   .Marland Kitchen  Multiple Vitamins-Minerals (MULTIVITAMIN WITH MINERALS) tablet Take 1 tablet by mouth 2 (two) times a week.  . propranolol ER (INDERAL LA) 80 MG 24 hr capsule Take 1 capsule by mouth 2 (two) times daily.  . simvastatin (ZOCOR) 40 MG tablet   . venlafaxine XR (EFFEXOR-XR) 37.5 MG 24 hr capsule Take 1 capsule (37.5 mg total) by mouth daily.  . vitamin C (ASCORBIC ACID) 500 MG tablet Take 500 mg by mouth daily.  . vitamin E 400  UNIT capsule Take 400 Units by mouth daily.   No facility-administered encounter medications on file as of 06/14/2018.      ONCOLOGIC FAMILY HISTORY:  Family History  Problem Relation Age of Onset  . Cancer Mother        Breast cancer  . Thrombosis Mother        Brain clot  . Breast cancer Mother 40  . Cancer Cousin        Breast cancer  . Hypertension Father   . Cancer Brother        Basal cell carcinoma  . Other Daughter        Alagille syndrome  . ALS Other        Husband    GENETIC COUNSELING/TESTING: Negative in 2011  SOCIAL HISTORY:  Renne Cornick is recently widowed, and lives alone [place], [place].  She has 3 children.  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  She denies any current or history of tobacco, alcohol, or illicit drug use.     PHYSICAL EXAMINATION:  Vital Signs: Vitals:   06/14/18 1340  BP: 128/89  Pulse: 60  Resp: 19  Temp: 97.8 F (36.6 C)  SpO2: 100%   Filed Weights   06/14/18 1340  Weight: 162 lb (73.5 kg)   General: Well-nourished, well-appearing female in no acute distress.  Unaccompanied today.   HEENT: Head is normocephalic.  Pupils equal and reactive to light. Conjunctivae clear without exudate.  Sclerae anicteric. Oral mucosa is pink, moist.  Oropharynx is pink without lesions or erythema.  Lymph: No cervical, supraclavicular, or infraclavicular lymphadenopathy noted on palpation.  Cardiovascular: Regular rate and rhythm.Marland Kitchen Respiratory: Clear to auscultation bilaterally. Chest expansion symmetric; breathing non-labored.  Breast Exam:  -Left breast: No appreciable masses on palpation. No skin redness, thickening, or peau d'orange appearance; no nipple retraction or nipple discharge; mild distortion in symmetry at previous lumpectomy site well healed scar without erythema or nodularity.  -Right breast: No appreciable masses on palpation. No skin redness, thickening, or peau d'orange appearance; no  nipple retraction or nipple discharge;  -Axilla: No axillary adenopathy bilaterally.  GI: Abdomen soft and round; non-tender, non-distended. Bowel sounds normoactive. No hepatosplenomegaly.   GU: Deferred.  Neuro: No focal deficits. Steady gait.  Psych: Mood and affect normal and appropriate for situation.  MSK: No focal spinal tenderness to palpation, full range of motion in bilateral upper extremities Extremities: No edema. Skin: Warm and dry.  LABORATORY DATA:  None for this visit   DIAGNOSTIC IMAGING:  Most recent mammogram:      ASSESSMENT AND PLAN:  Ms.. Picado is a pleasant 57 y.o. female with history of Stage IIB left breast invasive ductal carcinoma, ER+/PR+/HER2-, diagnosed in 01/2009, treated with lumpectomy, adjuvant chemotherapy, adjuvant radiation therapy, and anti-estrogen therapy with Tamoxifen followed by Letrozole x 6 years completing therapy in the summer of 2017.  She presents to the Survivorship Clinic for surveillance and routine follow-up.   1. History of breast  cancer:  Ms. Shroff is currently clinically and radiographically without evidence of disease or recurrence of breast cancer. She will be due for mammogram in 04/2019.  She and I reviewed that she can graduate today if she feels comfortable.  She agreed and from now on I will see her on an annual basis.      2. Bone health:  Given Ms. Duma's age, history of breast cancer, and her previous anti-estrogen therapy with Letrozole, she is at risk for bone demineralization.   She was given education on specific food and activities to promote bone health.  3. Cancer screening:  Due to Ms. Pezzullo's history and her age, she should receive screening for skin cancers, colon cancer, and gynecologic cancers. She was encouraged to follow-up with her PCP for appropriate cancer screenings.   4. Health maintenance and wellness promotion: Ms. Burnett was encouraged to consume 5-7 servings of fruits and vegetables per day.  She was also encouraged to engage in moderate to vigorous exercise for 30 minutes per day most days of the week. She was instructed to limit her alcohol consumption and continue to abstain from tobacco use.    Dispo:  -Return to cancer center PRN -Mammogram in 04/2019   A total of (20) minutes of face-to-face time was spent with this patient with greater than 50% of that time in counseling and care-coordination.   Gardenia Phlegm, [place] [phone]   Note: PRIMARY CARE PROVIDER Vicenta Aly, [place] [phone]

## 2018-06-14 NOTE — Patient Instructions (Signed)
Bone Health Bones protect organs, store calcium, and anchor muscles. Good health habits, such as eating nutritious foods and exercising regularly, are important for maintaining healthy bones. They can also help to prevent a condition that causes bones to lose density and become weak and brittle (osteoporosis). Why is bone mass important? Bone mass refers to the amount of bone tissue that you have. The higher your bone mass, the stronger your bones. An important step toward having healthy bones throughout life is to have strong and dense bones during childhood. A young adult who has a high bone mass is more likely to have a high bone mass later in life. Bone mass at its greatest it is called peak bone mass. A large decline in bone mass occurs in older adults. In women, it occurs about the time of menopause. During this time, it is important to practice good health habits, because if more bone is lost than what is replaced, the bones will become less healthy and more likely to break (fracture). If you find that you have a low bone mass, you may be able to prevent osteoporosis or further bone loss by changing your diet and lifestyle. How can I find out if my bone mass is low? Bone mass can be measured with an X-ray test that is called a bone mineral density (BMD) test. This test is recommended for all women who are age 65 or older. It may also be recommended for men who are age 70 or older, or for people who are more likely to develop osteoporosis due to:  Having bones that break easily.  Having a long-term disease that weakens bones, such as kidney disease or rheumatoid arthritis.  Having menopause earlier than normal.  Taking medicine that weakens bones, such as steroids, thyroid hormones, or hormone treatment for breast cancer or prostate cancer.  Smoking.  Drinking three or more alcoholic drinks each day.  What are the nutritional recommendations for healthy bones? To have healthy bones, you  need to get enough of the right minerals and vitamins. Most nutrition experts recommend getting these nutrients from the foods that you eat. Nutritional recommendations vary from person to person. Ask your health care provider what is healthy for you. Here are some general guidelines. Calcium Recommendations Calcium is the most important (essential) mineral for bone health. Most people can get enough calcium from their diet, but supplements may be recommended for people who are at risk for osteoporosis. Good sources of calcium include:  Dairy products, such as low-fat or nonfat milk, cheese, and yogurt.  Dark green leafy vegetables, such as bok choy and broccoli.  Calcium-fortified foods, such as orange juice, cereal, bread, soy beverages, and tofu products.  Nuts, such as almonds.  Follow these recommended amounts for daily calcium intake:  Children, age 1?3: 700 mg.  Children, age 4?8: 1,000 mg.  Children, age 9?13: 1,300 mg.  Teens, age 14?18: 1,300 mg.  Adults, age 19?50: 1,000 mg.  Adults, age 51?70: ? Men: 1,000 mg. ? Women: 1,200 mg.  Adults, age 71 or older: 1,200 mg.  Pregnant and breastfeeding females: ? Teens: 1,300 mg. ? Adults: 1,000 mg.  Vitamin D Recommendations Vitamin D is the most essential vitamin for bone health. It helps the body to absorb calcium. Sunlight stimulates the skin to make vitamin D, so be sure to get enough sunlight. If you live in a cold climate or you do not get outside often, your health care provider may recommend that you take vitamin   D supplements. Good sources of vitamin D in your diet include:  Egg yolks.  Saltwater fish.  Milk and cereal fortified with vitamin D.  Follow these recommended amounts for daily vitamin D intake:  Children and teens, age 1?18: 600 international units.  Adults, age 50 or younger: 400-800 international units.  Adults, age 51 or older: 800-1,000 international units.  Other Nutrients Other nutrients  for bone health include:  Phosphorus. This mineral is found in meat, poultry, dairy foods, nuts, and legumes. The recommended daily intake for adult men and adult women is 700 mg.  Magnesium. This mineral is found in seeds, nuts, dark green vegetables, and legumes. The recommended daily intake for adult men is 400?420 mg. For adult women, it is 310?320 mg.  Vitamin K. This vitamin is found in green leafy vegetables. The recommended daily intake is 120 mg for adult men and 90 mg for adult women.  What type of physical activity is best for building and maintaining healthy bones? Weight-bearing and strength-building activities are important for building and maintaining peak bone mass. Weight-bearing activities cause muscles and bones to work against gravity. Strength-building activities increases muscle strength that supports bones. Weight-bearing and muscle-building activities include:  Walking and hiking.  Jogging and running.  Dancing.  Gym exercises.  Lifting weights.  Tennis and racquetball.  Climbing stairs.  Aerobics.  Adults should get at least 30 minutes of moderate physical activity on most days. Children should get at least 60 minutes of moderate physical activity on most days. Ask your health care provide what type of exercise is best for you. Where can I find more information? For more information, check out the following websites:  National Osteoporosis Foundation: http://nof.org/learn/basics  National Institutes of Health: http://www.niams.nih.gov/Health_Info/Bone/Bone_Health/bone_health_for_life.asp  This information is not intended to replace advice given to you by your health care provider. Make sure you discuss any questions you have with your health care provider. Document Released: 10/04/2003 Document Revised: 02/01/2016 Document Reviewed: 07/19/2014 Elsevier Interactive Patient Education  2018 Elsevier Inc.  

## 2019-02-16 ENCOUNTER — Other Ambulatory Visit: Payer: Self-pay | Admitting: Nurse Practitioner

## 2019-02-16 DIAGNOSIS — Z1231 Encounter for screening mammogram for malignant neoplasm of breast: Secondary | ICD-10-CM

## 2019-04-27 ENCOUNTER — Encounter: Payer: Self-pay | Admitting: Adult Health

## 2019-04-27 NOTE — Progress Notes (Signed)
Faxed office notes to [employer] @ [facility] at [phone], confirmation received

## 2019-05-26 ENCOUNTER — Ambulatory Visit
Admission: RE | Admit: 2019-05-26 | Discharge: 2019-05-26 | Disposition: A | Discharge status: Home / Self Care | Payer: BC Managed Care – PPO | Source: Ambulatory Visit | Attending: Nurse Practitioner | Admitting: Nurse Practitioner

## 2019-05-26 ENCOUNTER — Other Ambulatory Visit: Payer: Self-pay

## 2019-05-26 DIAGNOSIS — Z1231 Encounter for screening mammogram for malignant neoplasm of breast: Secondary | ICD-10-CM

## 2019-05-26 HISTORY — DX: Personal history of antineoplastic chemotherapy: Z92.21

## 2020-01-30 ENCOUNTER — Ambulatory Visit: Payer: BC Managed Care – PPO | Attending: Internal Medicine

## 2020-01-30 DIAGNOSIS — Z20822 Contact with and (suspected) exposure to covid-19: Secondary | ICD-10-CM

## 2020-01-31 LAB — NOVEL CORONAVIRUS, NAA: SARS-CoV-2, NAA: NOT DETECTED

## 2020-01-31 LAB — SARS-COV-2, NAA 2 DAY TAT

## 2020-02-09 ENCOUNTER — Other Ambulatory Visit: Payer: Self-pay | Admitting: Nurse Practitioner

## 2020-02-09 DIAGNOSIS — Z1231 Encounter for screening mammogram for malignant neoplasm of breast: Secondary | ICD-10-CM

## 2020-05-09 ENCOUNTER — Encounter: Payer: Self-pay | Admitting: Oncology

## 2020-05-09 NOTE — Progress Notes (Signed)
Faxed medical records to Olean Ree from [facility] @ [phone], confirmation received

## 2020-05-28 ENCOUNTER — Ambulatory Visit: Payer: BC Managed Care – PPO

## 2020-05-31 ENCOUNTER — Other Ambulatory Visit: Payer: Self-pay

## 2020-05-31 ENCOUNTER — Ambulatory Visit: Payer: BC Managed Care – PPO | Admitting: Sports Medicine

## 2020-05-31 ENCOUNTER — Encounter: Payer: Self-pay | Admitting: Sports Medicine

## 2020-05-31 ENCOUNTER — Ambulatory Visit (INDEPENDENT_AMBULATORY_CARE_PROVIDER_SITE_OTHER): Payer: BC Managed Care – PPO

## 2020-05-31 DIAGNOSIS — S9032XA Contusion of left foot, initial encounter: Secondary | ICD-10-CM | POA: Diagnosis not present

## 2020-05-31 DIAGNOSIS — M79672 Pain in left foot: Secondary | ICD-10-CM

## 2020-05-31 DIAGNOSIS — M779 Enthesopathy, unspecified: Secondary | ICD-10-CM

## 2020-05-31 DIAGNOSIS — E119 Type 2 diabetes mellitus without complications: Secondary | ICD-10-CM | POA: Diagnosis not present

## 2020-05-31 MED ORDER — METHYLPREDNISOLONE 4 MG PO TBPK: ORAL_TABLET | ORAL | 0 refills | Status: AC

## 2020-05-31 NOTE — Progress Notes (Signed)
Subjective: Alexa Kelley is a 59 y.o. female patient who presents to office for evaluation of left foot pain. Patient complains of progressive pain especially over the last 2 months in the left foot at the lateral aspect that started after closing a car door on foot and has not gotten better. Patient has tried slip on brace with no relief in symptoms. Patient denies any other pedal complaints.   Review of Systems  All other systems reviewed and are negative.   Patient Active Problem List   Diagnosis Date Noted   Essential hypertension 01/02/2017   History of breast cancer 01/02/2017   IFG (impaired fasting glucose) 01/02/2017   Major depression, recurrent, chronic ([place]) 01/02/2017   Mixed hyperlipidemia 01/02/2017   Breast cancer of lower-outer quadrant of left female breast ([place]) 11/10/2014   Pain of upper abdomen 11/10/2014   Lymphedema 11/10/2014   Transaminitis 11/10/2014   Hyperglycemia 11/10/2014    Current Outpatient Medications on File Prior to Visit  Medication Sig Dispense Refill   aspirin 81 MG chewable tablet Chew 81 mg by mouth daily.     cholecalciferol (VITAMIN D) 1000 UNITS tablet Take 1,000 Units by mouth 2 (two) times daily.     COCONUT OIL PO Take 1 capsule by mouth every morning.     fish oil-omega-3 fatty acids 1000 MG capsule Take 2 g by mouth daily.     lisinopril (ZESTRIL) 20 MG tablet TAKE 1 TABLET(20 MG) BY MOUTH DAILY     lisinopril (ZESTRIL) 20 MG tablet Take 20 mg by mouth daily.     LORazepam (ATIVAN) 1 MG tablet 0.5 mg at bedtime.      metFORMIN (GLUCOPHAGE) 500 MG tablet Take 500 mg by mouth 2 (two) times daily.     Multiple Vitamins-Minerals (MULTIVITAMIN WITH MINERALS) tablet Take 1 tablet by mouth 2 (two) times a week.     propranolol ER (INDERAL LA) 80 MG 24 hr capsule Take 1 capsule by mouth 2 (two) times daily.     simvastatin (ZOCOR) 40 MG tablet      venlafaxine XR (EFFEXOR-XR) 37.5 MG 24 hr capsule Take 1 capsule (37.5  mg total) by mouth daily. 30 capsule 6   vitamin C (ASCORBIC ACID) 500 MG tablet Take 500 mg by mouth daily.     vitamin E 400 UNIT capsule Take 400 Units by mouth daily.     No current facility-administered medications on file prior to visit.    Allergies  Allergen Reactions   Codeine Nausea And Vomiting    Objective:  General: Alert and oriented x3 in no acute distress  Dermatology: No open lesions bilateral lower extremities, no webspace macerations, no ecchymosis bilateral, all nails x 10 are well manicured.  Vascular: Dorsalis Pedis and Posterior Tibial pedal pulses palpable, Capillary Fill Time 3 seconds,(+) pedal hair growth bilateral, no edema bilateral lower extremities, Temperature gradient within normal limits.  Neurology: Johney Maine sensation intact via light touch bilateral.  Musculoskeletal: Mild tenderness with palpation at PB insertion at 5th met base on left. Pain worse with PF and inversion of left foot. Residual bunion deformity. Pes planus foot type. Strength within normal limits in all groups bilateral except left due to pain   Gait: Mildly Antalgic gait  Xrays  Left Foot   Impression: No acute osseous findings  Assessment and Plan: Problem List Items Addressed This Visit    None    Visit Diagnoses    Contusion of left foot, initial encounter    -  Primary   Relevant Orders   DG Foot Complete Left   Tendonitis       Left foot pain       Diabetes mellitus without complication (HCC)       Relevant Medications   metFORMIN (GLUCOPHAGE) 500 MG tablet   lisinopril (ZESTRIL) 20 MG tablet   lisinopril (ZESTRIL) 20 MG tablet       -Complete examination performed -Xrays reviewed -Discussed treatement options for likely tendonitis/continued pain after previous injury -Rx medrol -Advised patient to use trilock brace that she already has -Advised topicals, rest, ice, elevation, and avoid activities that can aggravate the foot -Advised patient if symptoms  continue may benefit from MRI to eval for tear or walking boot -Patient to return to office as scheduled in 4 weeks or sooner if condition worsens.  Landis Martins, DPM

## 2020-06-28 ENCOUNTER — Other Ambulatory Visit: Payer: Self-pay

## 2020-06-28 ENCOUNTER — Encounter: Payer: Self-pay | Admitting: Sports Medicine

## 2020-06-28 ENCOUNTER — Ambulatory Visit: Payer: BC Managed Care – PPO | Admitting: Sports Medicine

## 2020-06-28 DIAGNOSIS — E119 Type 2 diabetes mellitus without complications: Secondary | ICD-10-CM | POA: Diagnosis not present

## 2020-06-28 DIAGNOSIS — M79672 Pain in left foot: Secondary | ICD-10-CM

## 2020-06-28 DIAGNOSIS — M779 Enthesopathy, unspecified: Secondary | ICD-10-CM

## 2020-06-28 DIAGNOSIS — S9032XD Contusion of left foot, subsequent encounter: Secondary | ICD-10-CM | POA: Diagnosis not present

## 2020-06-28 NOTE — Progress Notes (Signed)
Subjective: Alexa Kelley is a 59 y.o. female patient who return for left foot pain feels much better pain now 2-3/10 prednisone made it 100% better but then pain started to come back. Trilock helps. No other issues noted.   Patient Active Problem List   Diagnosis Date Noted  . Essential hypertension 01/02/2017  . History of breast cancer 01/02/2017  . IFG (impaired fasting glucose) 01/02/2017  . Major depression, recurrent, chronic (HCC) 01/02/2017  . Mixed hyperlipidemia 01/02/2017  . Breast cancer of lower-outer quadrant of left female breast (HCC) 11/10/2014  . Pain of upper abdomen 11/10/2014  . Lymphedema 11/10/2014  . Transaminitis 11/10/2014  . Hyperglycemia 11/10/2014    Current Outpatient Medications on File Prior to Visit  Medication Sig Dispense Refill  . aspirin 81 MG chewable tablet Chew 81 mg by mouth daily.    . cholecalciferol (VITAMIN D) 1000 UNITS tablet Take 1,000 Units by mouth 2 (two) times daily.    . COCONUT OIL PO Take 1 capsule by mouth every morning.    . fish oil-omega-3 fatty acids 1000 MG capsule Take 2 g by mouth daily.    Marland Kitchen lisinopril (ZESTRIL) 20 MG tablet TAKE 1 TABLET(20 MG) BY MOUTH DAILY    . lisinopril (ZESTRIL) 20 MG tablet Take 20 mg by mouth daily.    Marland Kitchen LORazepam (ATIVAN) 1 MG tablet 0.5 mg at bedtime.     . metFORMIN (GLUCOPHAGE) 500 MG tablet Take 500 mg by mouth 2 (two) times daily.    . methylPREDNISolone (MEDROL DOSEPAK) 4 MG TBPK tablet Take as directed 21 tablet 0  . Multiple Vitamins-Minerals (MULTIVITAMIN WITH MINERALS) tablet Take 1 tablet by mouth 2 (two) times a week.    . propranolol ER (INDERAL LA) 80 MG 24 hr capsule Take 1 capsule by mouth 2 (two) times daily.    . simvastatin (ZOCOR) 40 MG tablet     . venlafaxine XR (EFFEXOR-XR) 37.5 MG 24 hr capsule Take 1 capsule (37.5 mg total) by mouth daily. 30 capsule 6  . vitamin C (ASCORBIC ACID) 500 MG tablet Take 500 mg by mouth daily.    . vitamin E 400 UNIT capsule Take 400  Units by mouth daily.     No current facility-administered medications on file prior to visit.    Allergies  Allergen Reactions  . Codeine Nausea And Vomiting    Objective:  General: Alert and oriented x3 in no acute distress  Dermatology: No open lesions bilateral lower extremities, no webspace macerations, no ecchymosis bilateral, all nails x 10 are well manicured.  Vascular: Dorsalis Pedis and Posterior Tibial pedal pulses palpable, Capillary Fill Time 3 seconds,(+) pedal hair growth bilateral, no edema bilateral lower extremities, Temperature gradient within normal limits.  Neurology: Michaell Cowing sensation intact via light touch bilateral.  Musculoskeletal: No residual tenderness with palpation at PB insertion at 5th met base on left. Pain worse with PF and inversion of left foot. Residual bunion deformity. Pes planus foot type. Strength within normal limits in all groups bilateral except left due to pain   Assessment and Plan: Problem List Items Addressed This Visit    None    Visit Diagnoses    Tendonitis    -  Primary   Left foot pain       Contusion of left foot, subsequent encounter       Diabetes mellitus without complication (HCC)           -Complete examination performed -Re-Discussed treatment options improving tendonitis -Advised patient  to use trilock brace that she already has for 1 more week before starting to wean -Advised topicals, rest,  ice, elevation, and avoid activities that can aggravate the foot like previous as long as it does not increase the pain -Patient to return to office if fails to continue to improve or sooner if condition worsens.  Landis Martins, DPM

## 2020-07-06 ENCOUNTER — Other Ambulatory Visit: Payer: Self-pay

## 2020-07-06 ENCOUNTER — Ambulatory Visit
Admission: RE | Admit: 2020-07-06 | Discharge: 2020-07-06 | Disposition: A | Discharge status: Home / Self Care | Payer: BC Managed Care – PPO | Source: Ambulatory Visit | Attending: Nurse Practitioner | Admitting: Nurse Practitioner

## 2020-07-06 DIAGNOSIS — Z1231 Encounter for screening mammogram for malignant neoplasm of breast: Secondary | ICD-10-CM

## 2021-04-02 ENCOUNTER — Telehealth: Payer: Self-pay | Admitting: *Deleted

## 2021-04-02 NOTE — Telephone Encounter (Signed)
Received call from pt with complaint of left breast pain and swelling x5 days.  Pt denies recent injury or trauma.  Pt very anxious and requesting to be seen by Wilber Bihari, NP for a breast exam prior to any interventions.  Apt scheduled and pt verbalized understanding of apt date and time.

## 2021-04-04 ENCOUNTER — Inpatient Hospital Stay: Payer: BC Managed Care – PPO | Attending: Adult Health | Admitting: Adult Health

## 2021-04-04 ENCOUNTER — Other Ambulatory Visit: Payer: Self-pay

## 2021-04-04 VITALS — BP 130/79 | HR 51 | Temp 97.5°F | Resp 18 | Ht 65.0 in | Wt 159.2 lb

## 2021-04-04 DIAGNOSIS — Z853 Personal history of malignant neoplasm of breast: Secondary | ICD-10-CM | POA: Diagnosis not present

## 2021-04-04 DIAGNOSIS — Z170 Estrogen receptor positive status [ER+]: Secondary | ICD-10-CM | POA: Diagnosis not present

## 2021-04-04 DIAGNOSIS — C50512 Malignant neoplasm of lower-outer quadrant of left female breast: Secondary | ICD-10-CM | POA: Diagnosis not present

## 2021-04-04 NOTE — Progress Notes (Signed)
[facility] Follow up:    Alexa Kelley, [address] [place] [address]   DIAGNOSIS: Cancer Staging No matching staging information was found for the patient.  SUMMARY OF ONCOLOGIC HISTORY: Oncology History  Breast cancer of lower-outer quadrant of left Kelley breast ([place])  01/30/2009 Initial Diagnosis   left breast excisional biopsy on 02/09/2009 which showed a ER positive, PR positive, HER-2/neu negative tumor measuring 1.0 cm   03/16/2009 Surgery   stage IIA, T1b N57m+, invasive ductal carcinoma, residual tumor measured 0.4 cm, with no evidence of lymphovascular invasion, surgical margins were free of tumor, with 1/3 positive lymph nodes for micrometastasis (0.5 mm). 0/16 LN   04/10/2009 - 09/03/2009 Chemotherapy   ECOG 5103 study and is status post 4 cycles of chemotherapy with dose dense Doxorubicin/Cyclophosphamide followed by 12 cycles of every 3 weeks Taxol/Avastin vs placebo (Patient was on placebo arm)   11/06/2009 - 12/21/2009 Radiation Therapy   Adjuvant radiation therapy   11/22/2009 Procedure   Genetic testing negative for BRCA1 and BRCA2 gene mutations   12/04/2009 - 02/2016 Anti-estrogen oral therapy   Tamoxifen started May 2011 switched to letrozole June 2012 where she became postmenopausal     CURRENT THERAPY: observation  INTERVAL HISTORY: Alexa Kelley returns for urgent evaluation of breast pain.  This is in her left breast at the chest wall, along the rib bone.  She says the tissue feels denser in this location.  No recent vaccinations.  She has no erythema to the breast.  She has no nipple drainage.  She has no skin changes to the breast.  Her most recent mammogram was completed 07/10/2020 and showed no evidence of malignancy and breast density B.    Patient Active Problem List   Diagnosis Date Noted   Essential hypertension 01/02/2017   History of breast cancer 01/02/2017   IFG (impaired fasting  glucose) 01/02/2017   Major depression, recurrent, chronic ([place] 01/02/2017   Mixed hyperlipidemia 01/02/2017   Breast cancer of lower-outer quadrant of left Kelley breast ([place] 11/10/2014   Pain of upper abdomen 11/10/2014   Lymphedema 11/10/2014   Transaminitis 11/10/2014   Hyperglycemia 11/10/2014    is allergic to codeine.  MEDICAL HISTORY: Past Medical History:  Diagnosis Date   Breast cancer ([facility]    Hyperlipidemia    Personal history of chemotherapy    Personal history of radiation therapy    Skin cancer     SURGICAL HISTORY: Past Surgical History:  Procedure Laterality Date   BREAST LUMPECTOMY Left 2010   left breast    BUNIONECTOMY Left    POLYPECTOMY      SOCIAL HISTORY: Social History   Socioeconomic History   Marital status: Widowed    Spouse name: Not on file   Number of children: Not on file   Years of education: Not on file   Highest education level: Not on file  Occupational History   Not on file  Tobacco Use   Smoking status: Never   Smokeless tobacco: Never  Substance and Sexual Activity   Alcohol use: Yes    Alcohol/week: 1.0 standard drink    Types: 1 Glasses of wine per week   Drug use: No   Sexual activity: Not Currently    Birth control/protection: Post-menopausal  Other Topics Concern   Not on file  Social History Narrative   Not on file   Social Determinants of Health   Financial Resource Strain: Not  on file  Food Insecurity: Not on file  Transportation Needs: Not on file  Physical Activity: Not on file  Stress: Not on file  Social Connections: Not on file  Intimate Partner Violence: Not on file    FAMILY HISTORY: Family History  Problem Relation Age of Onset   Cancer Mother        Breast cancer   Thrombosis Mother        Brain clot   Breast cancer Mother 50   Cancer Cousin        Breast cancer   Hypertension Father    Cancer Brother        Basal cell carcinoma   Other Daughter        Alagille syndrome   ALS  Other        Husband    Review of Systems  Constitutional:  Negative for appetite change, chills, fatigue, fever and unexpected weight change.  HENT:   Negative for hearing loss, lump/mass and trouble swallowing.   Eyes:  Negative for eye problems and icterus.  Respiratory:  Negative for chest tightness, cough and shortness of breath.   Cardiovascular:  Negative for chest pain, leg swelling and palpitations.  Gastrointestinal:  Negative for abdominal distention, abdominal pain, constipation, diarrhea, nausea and vomiting.  Endocrine: Negative for hot flashes.  Genitourinary:  Negative for difficulty urinating.   Musculoskeletal:  Negative for arthralgias.  Skin:  Negative for itching and rash.  Neurological:  Negative for dizziness, extremity weakness, headaches and numbness.  Hematological:  Negative for adenopathy. Does not bruise/bleed easily.  Psychiatric/Behavioral:  Negative for depression. The patient is not nervous/anxious.      PHYSICAL EXAMINATION  ECOG PERFORMANCE STATUS: 1 - Symptomatic but completely ambulatory  Vitals:   04/04/21 0833  BP: 130/79  Pulse: (!) 51  Resp: 18  Temp: (!) 97.5 F (36.4 C)  SpO2: 100%    Physical Exam Constitutional:      General: She is not in acute distress.    Appearance: Normal appearance. She is not toxic-appearing.  HENT:     Head: Normocephalic and atraumatic.  Eyes:     General: No scleral icterus. Cardiovascular:     Rate and Rhythm: Normal rate and regular rhythm.     Pulses: Normal pulses.     Heart sounds: Normal heart sounds.  Pulmonary:     Effort: Pulmonary effort is normal.     Breath sounds: Normal breath sounds.     Comments: Right breast benign, left breast s/p lumpectomy, slight area of thickness in the skin in the upper inner quadrant of the breast, otherwise benign Abdominal:     General: Abdomen is flat. Bowel sounds are normal. There is no distension.     Palpations: Abdomen is soft.     Tenderness:  There is no abdominal tenderness.  Musculoskeletal:        General: No swelling.     Cervical back: Neck supple.  Lymphadenopathy:     Cervical: No cervical adenopathy.  Skin:    General: Skin is warm and dry.     Findings: No rash.  Neurological:     General: No focal deficit present.     Mental Status: She is alert.  Psychiatric:        Mood and Affect: Mood normal.        Behavior: Behavior normal.    LABORATORY DATA:  CBC    Component Value Date/Time   WBC 4.4 11/23/2014 0941   RBC  4.11 11/23/2014 0941   HGB 12.6 11/23/2014 0941   HCT 37.7 11/23/2014 0941   PLT 237 11/23/2014 0941   MCV 91.7 11/23/2014 0941   MCH 30.7 11/23/2014 0941   MCHC 33.4 11/23/2014 0941   RDW 12.5 11/23/2014 0941   LYMPHSABS 1.8 11/23/2014 0941   MONOABS 0.4 11/23/2014 0941   EOSABS 0.2 11/23/2014 0941   BASOSABS 0.1 11/23/2014 0941    CMP     Component Value Date/Time   NA 140 11/23/2014 0941   K 3.8 11/23/2014 0941   CL 102 11/09/2014 1056   CL 102 01/17/2013 1315   CO2 26 11/23/2014 0941   GLUCOSE 124 11/23/2014 0941   GLUCOSE 108 (H) 01/17/2013 1315   BUN 14.9 11/23/2014 0941   CREATININE 0.8 11/23/2014 0941   CALCIUM 9.5 11/23/2014 0941   PROT 7.0 11/23/2014 0941   ALBUMIN 4.2 11/23/2014 0941   AST 26 11/23/2014 0941   ALT 39 11/23/2014 0941   ALKPHOS 70 11/23/2014 0941   BILITOT 0.59 11/23/2014 0941   GFRNONAA >90 11/09/2014 1056   GFRAA >90 11/09/2014 1056        ASSESSMENT and THERAPY PLAN:   Breast cancer of lower-outer quadrant of left Kelley breast ([place]) Alexa Kelley is here today for f/u of a breast change in her left breast.  Due to the focal tenderness and change in the breast that she has noticed we will get a mammogram and ultrasound of the left breast to evaluate further.  Should the pain continue, we can consider chest xray or CT if the mammogram and ultrasound are negative.  Alexa Kelley understands this and we will reach out to her with her appt date/time.   Otherwise she is doing well with no concerns.     Orders Placed This Encounter  Procedures   MM DIAG BREAST TOMO UNI LEFT    Ins: bcbs state Pf: 07/06/20_0   pain and thickness in left breast upper inner quadrant No needs/ no implants/ no reduction/ mm with kelsy @ [facility] [phone] / order cosigned     Standing Status:   Future    Standing Expiration Date:   04/04/2022    Order Specific Question:   Reason for Exam (SYMPTOM  OR DIAGNOSIS REQUIRED)    Answer:   pain and thickness in left breast upper inner quadrant    Order Specific Question:   Preferred imaging location?    Answer:   [facility]    Order Specific Question:   Is the patient pregnant?    Answer:   No   US BREAST LTD UNI LEFT INC AXILLA    Ins: bcbs state Pf: 07/06/20_1   pain and thickness in left breast upper inner quadrant No needs/ no implants/ no reduction/ mm with kelsy @ [facility] [phone] / order cosigned      Standing Status:   Future    Standing Expiration Date:   04/04/2022    Order Specific Question:   Reason for Exam (SYMPTOM  OR DIAGNOSIS REQUIRED)    Answer:   pain and thickness in left breast upper inner quadrant    Order Specific Question:   Preferred imaging location?    Answer:   [facility]    All questions were answered. The patient knows to call the clinic with any problems, questions or concerns. We can certainly see the patient much sooner if necessary.  Total encounter time: 20 minutes in face to face visit time, chart review, order entry, care coordination and documentation of the  encounter.   Wilber Bihari, NP 04/05/21 9:29 AM Medical Oncology and Hematology [facility] [place], [place] [address] Tel. [phone]    Fax. [phone]  *Total Encounter Time as defined by the Centers for Medicare and Medicaid Services includes, in addition to the face-to-face time of a patient visit (documented in the note above) non-face-to-face time: obtaining and  reviewing outside history, ordering and reviewing medications, tests or procedures, care coordination (communications with other health care professionals or caregivers) and documentation in the medical record.

## 2021-04-05 ENCOUNTER — Encounter: Payer: Self-pay | Admitting: Adult Health

## 2021-04-05 NOTE — Assessment & Plan Note (Signed)
[name] is here today for f/u of a breast change in her left breast.  Due to the focal tenderness and change in the breast that she has noticed we will get a mammogram and ultrasound of the left breast to evaluate further.  Should the pain continue, we can consider chest xray or CT if the mammogram and ultrasound are negative.  Alexa Kelley understands this and we will reach out to her with her appt date/time.  Otherwise she is doing well with no concerns.

## 2021-04-13 ENCOUNTER — Ambulatory Visit
Admission: RE | Admit: 2021-04-13 | Discharge: 2021-04-13 | Disposition: A | Discharge status: Home / Self Care | Payer: BC Managed Care – PPO | Source: Ambulatory Visit | Attending: Adult Health | Admitting: Adult Health

## 2021-04-13 ENCOUNTER — Other Ambulatory Visit: Payer: Self-pay

## 2021-04-13 DIAGNOSIS — Z170 Estrogen receptor positive status [ER+]: Secondary | ICD-10-CM

## 2021-06-03 ENCOUNTER — Other Ambulatory Visit: Payer: Self-pay | Admitting: Nurse Practitioner

## 2021-06-03 DIAGNOSIS — Z1231 Encounter for screening mammogram for malignant neoplasm of breast: Secondary | ICD-10-CM

## 2021-07-10 ENCOUNTER — Ambulatory Visit
Admission: RE | Admit: 2021-07-10 | Discharge: 2021-07-10 | Disposition: A | Discharge status: Home / Self Care | Payer: BC Managed Care – PPO | Source: Ambulatory Visit | Attending: Nurse Practitioner | Admitting: Nurse Practitioner

## 2021-07-10 DIAGNOSIS — Z1231 Encounter for screening mammogram for malignant neoplasm of breast: Secondary | ICD-10-CM

## 2021-10-25 ENCOUNTER — Encounter: Payer: Self-pay | Admitting: Hematology and Oncology

## 2021-10-25 NOTE — Progress Notes (Unsigned)
Faxed recent Mammogram and office note to Janeann Forehand @ [facility] at [phone], confirmation received ?

## 2022-04-25 ENCOUNTER — Other Ambulatory Visit: Payer: Self-pay | Admitting: Nurse Practitioner

## 2022-04-25 DIAGNOSIS — Z1231 Encounter for screening mammogram for malignant neoplasm of breast: Secondary | ICD-10-CM

## 2022-05-21 ENCOUNTER — Encounter: Payer: Self-pay | Admitting: Hematology and Oncology

## 2022-05-21 NOTE — Progress Notes (Unsigned)
Received request from Olean Ree from [facility] for records, sent mammogram to [phone], confirmation received

## 2022-07-11 ENCOUNTER — Ambulatory Visit
Admission: RE | Admit: 2022-07-11 | Discharge: 2022-07-11 | Disposition: A | Discharge status: Home / Self Care | Payer: BC Managed Care – PPO | Source: Ambulatory Visit | Attending: Nurse Practitioner | Admitting: Nurse Practitioner

## 2022-07-11 DIAGNOSIS — Z1231 Encounter for screening mammogram for malignant neoplasm of breast: Secondary | ICD-10-CM

## 2022-11-04 ENCOUNTER — Other Ambulatory Visit: Payer: Self-pay

## 2022-11-04 ENCOUNTER — Ambulatory Visit: Payer: BC Managed Care – PPO | Admitting: Sports Medicine

## 2022-11-04 VITALS — BP 124/86 | Ht 65.0 in | Wt 166.0 lb

## 2022-11-04 DIAGNOSIS — M25522 Pain in left elbow: Secondary | ICD-10-CM | POA: Insufficient documentation

## 2022-11-04 MED ORDER — AZITHROMYCIN 250 MG PO TABS: ORAL_TABLET | ORAL | 0 refills | Status: AC

## 2022-11-04 NOTE — Assessment & Plan Note (Signed)
Patient has clinical and radiographical, ultrasound evidence of possible lymphadenitis, likely secondary to cat scratch.  I have sent to her pharmacy azithromycin for the next 5 days.  If her symptoms worsen or fail to improve over the next week recommend she call the office and we can send her for biopsy evaluation.

## 2022-11-04 NOTE — Progress Notes (Signed)
   New Patient Office Visit  Subjective   Patient ID: Alexa Kelley, female    DOB: [birth year]  Age: 62 y.o. MRN: [id number]  Bump on left elbow.  Alexa Kelley is here today with tender lump on the inside of her left elbow.  She noticed this about a week ago.  She does not have any pain with specific movements but noticed if anything touches the area she gets discomfort.  She noticed that when she was pulling her lab jacket upper arm and the compression caused her noticed a lump.  She does have cats at home and gets scratches occasionally.  Of note she also has a remote cancer history, breast and skin cancer her breast cancer has been in remission since 2010.  She did have some lymph nodes removed her left axilla and down her arm.  She denies any fevers, chills, unintentional weight loss or night sweats.  The area is not red or draining.   ROS as listed above in HPI    Objective:     BP 124/86   Ht 5\' 5"  (1.651 m)   Wt 166 lb (75.3 kg)   BMI 27.62 kg/m   Physical Exam Vitals reviewed.  Constitutional:      General: She is not in acute distress.    Appearance: Normal appearance. She is not ill-appearing, toxic-appearing or diaphoretic.  Pulmonary:     Effort: Pulmonary effort is normal.  Musculoskeletal:     Comments: There is a palpable mass the medial side of her left elbow.  No ecchymosis or erythema over the area.  There are some tenderness to palpation over the area.  Grip strength 5/5.  Full range of motion at the elbow.  Neurological:     Mental Status: She is alert.   Limited ultrasound: Left elbow On the medial aspect of the elbow near the trochlea there is a well-circumscribed hyperechoic area with hypoechoic centralized area.  Neovessels are present but not extensive. Impression: suspicious for an inflamed lymph node Ultrasound and interpretation by Alexa Kelley. Alexa Penna, MD  and Alexa Kelley. Alexa Kelley. DO    Assessment & Plan:   Problem List Items Addressed This Visit        Other   Left elbow pain - Primary    Patient has clinical and radiographical, ultrasound evidence of possible lymphadenitis, likely secondary to cat scratch.  I have sent to her pharmacy azithromycin for the next 5 days.  If her symptoms worsen or fail to improve over the next week recommend she call the office and we can send her for biopsy evaluation.       Relevant Orders   [place] LIMITED JOINT SPACE STRUCTURES UP LEFT    No follow-ups on file.    Alexa Leach, DO  I observed and examined the patient with the Alexa Kelley and agree with assessment and plan.  Note reviewed and modified by me. Note [place] was reassuring in that it did not show more classic signs of malignancy and it has been 14 years since breast CA.  We will follow closely but epitrochlear position is most common with cat scratch.  Sterling Big, MD

## 2023-02-04 IMAGING — MG MM DIGITAL SCREENING BILAT W/ TOMO AND CAD
6 of 10 series · 6 of 30 positions shown · non-contrast
Comparison: Previous exam(s).

CLINICAL DATA: Screening.

EXAM:
DIGITAL SCREENING BILATERAL MAMMOGRAM WITH TOMOSYNTHESIS AND CAD
TECHNIQUE: Bilateral screening digital craniocaudal and mediolateral oblique
mammograms were obtained. Bilateral screening digital breast
tomosynthesis was performed. The images were evaluated with
computer-aided detection.

[R MLO synth-2D]
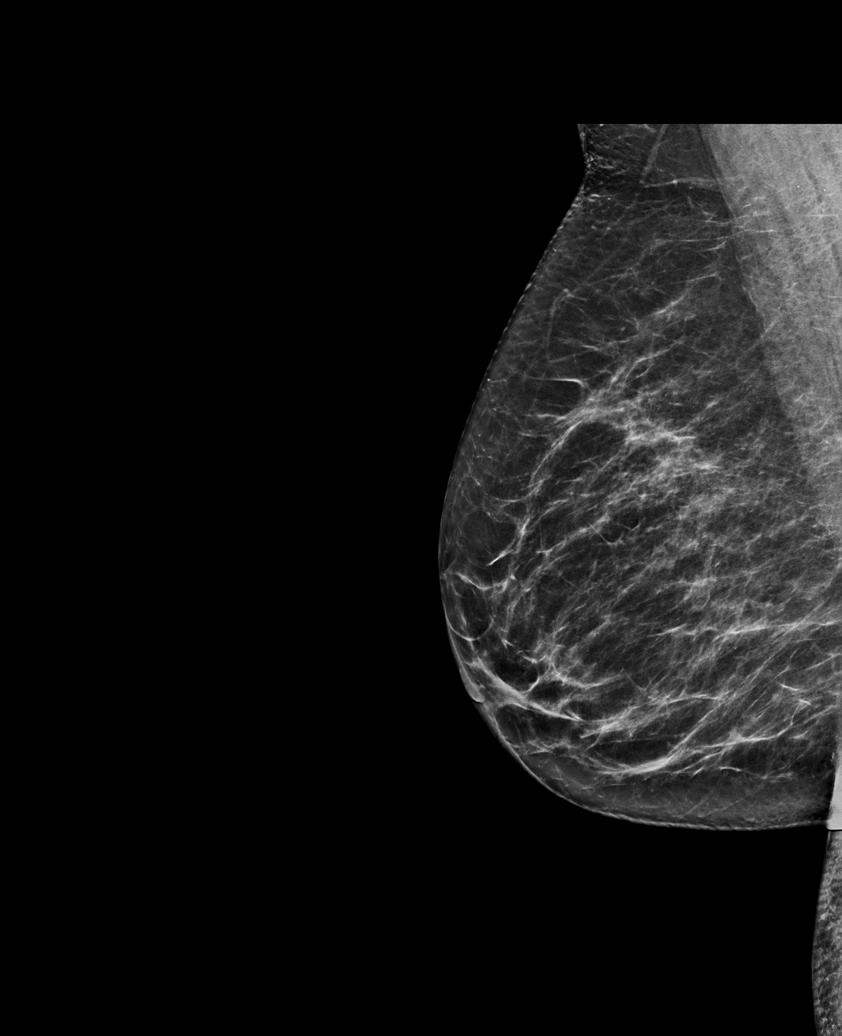

[R CC synth-2D]
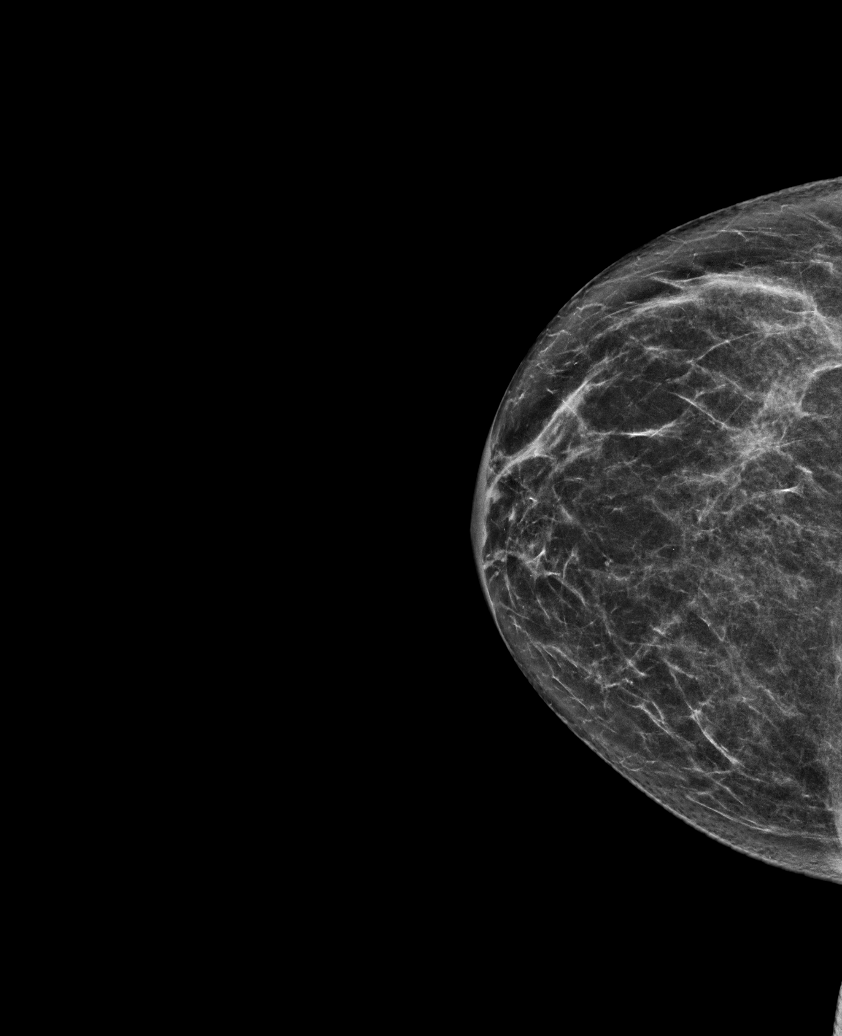

[L CC synth-2D]
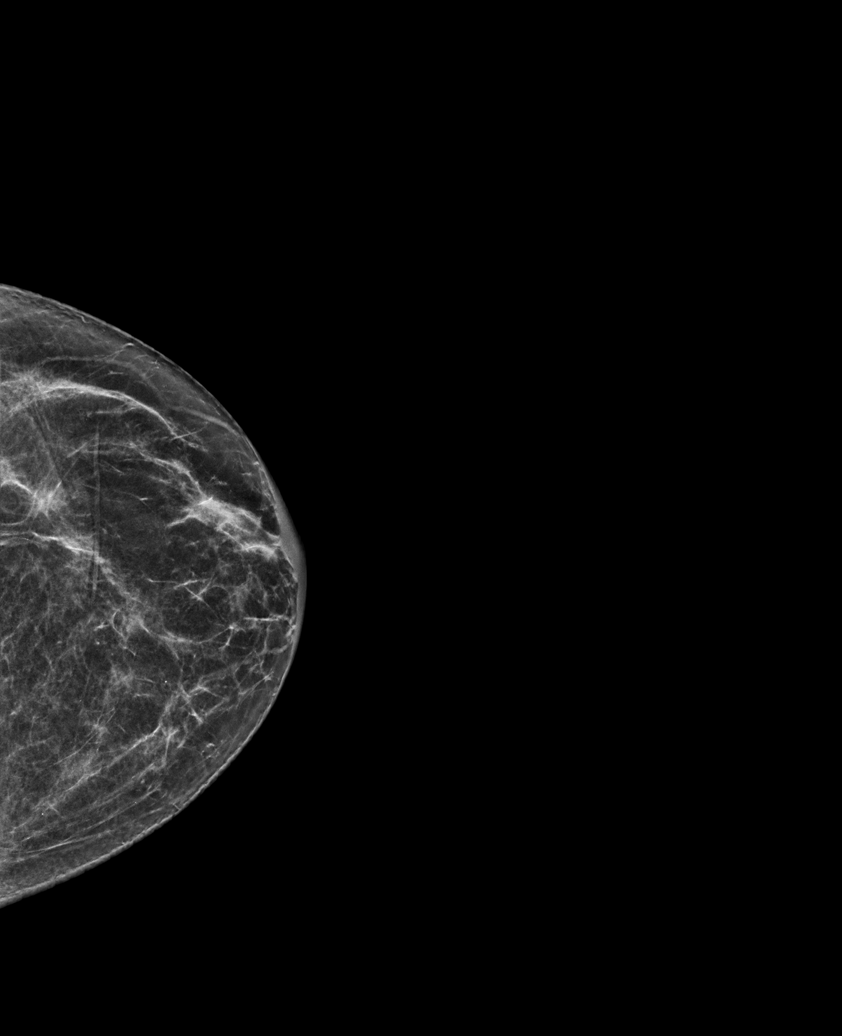

[L MLO synth-2D (1 of 2)]
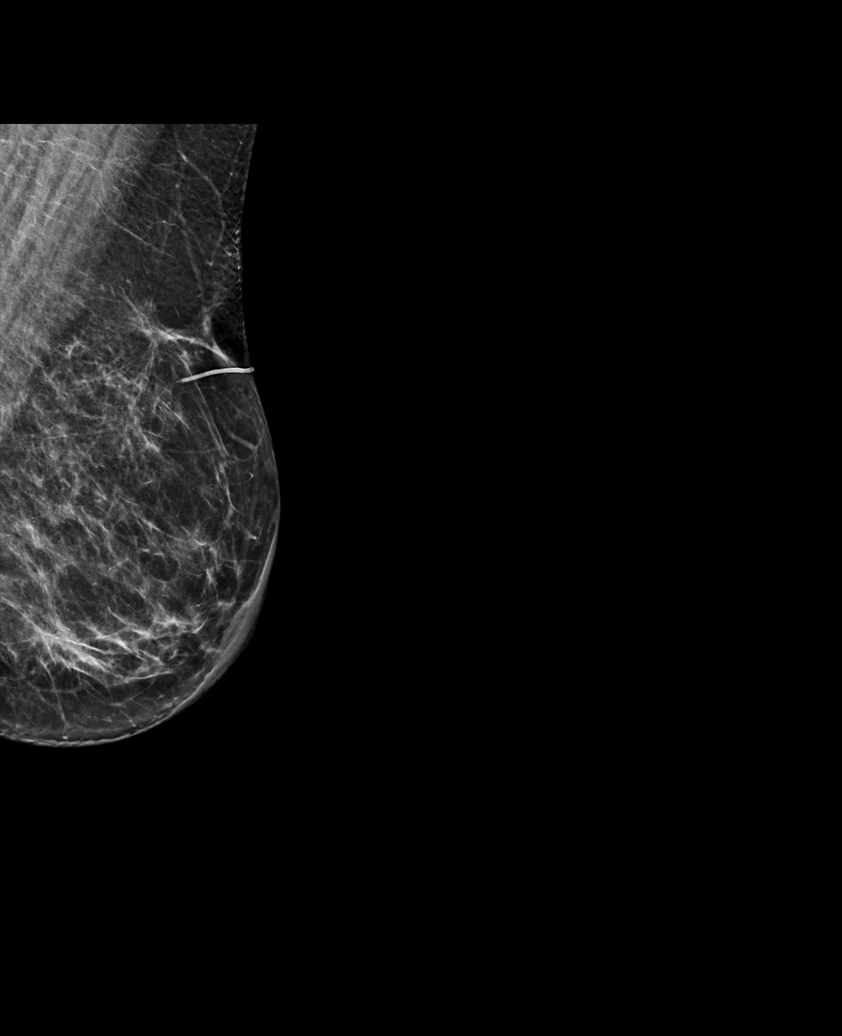

[L MLO synth-2D (2 of 2)]
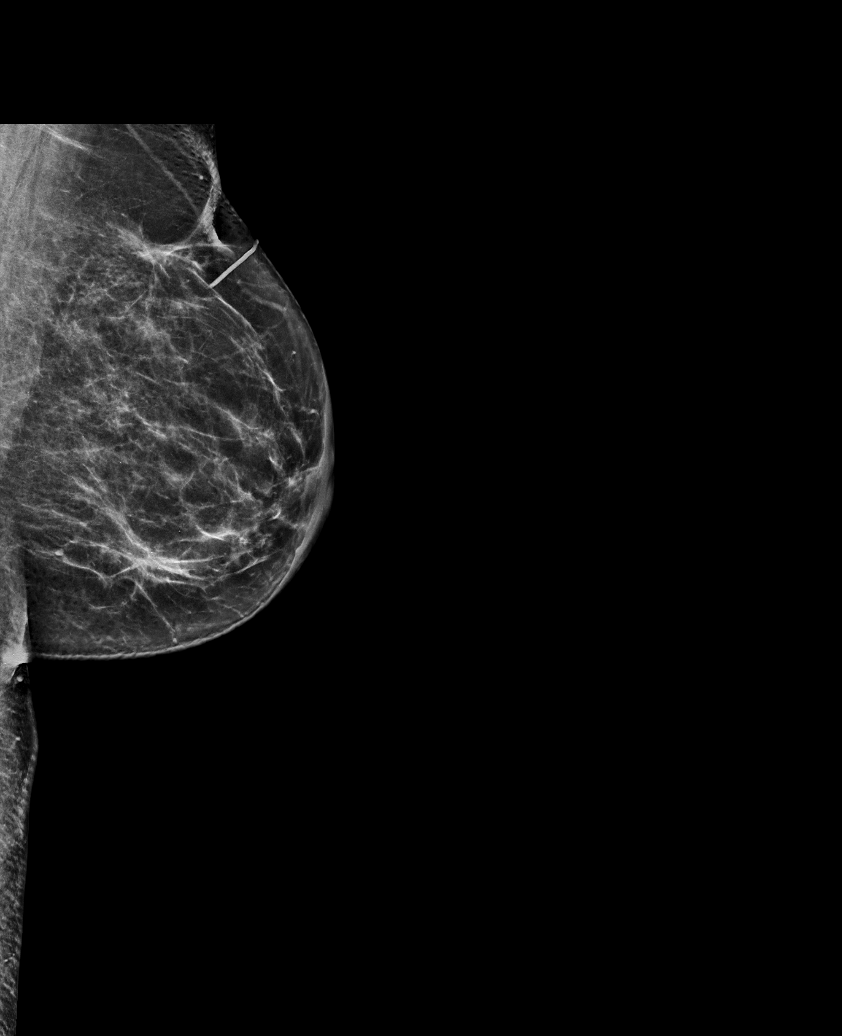

[L MLO tomo · tomo slice 31/62.0]
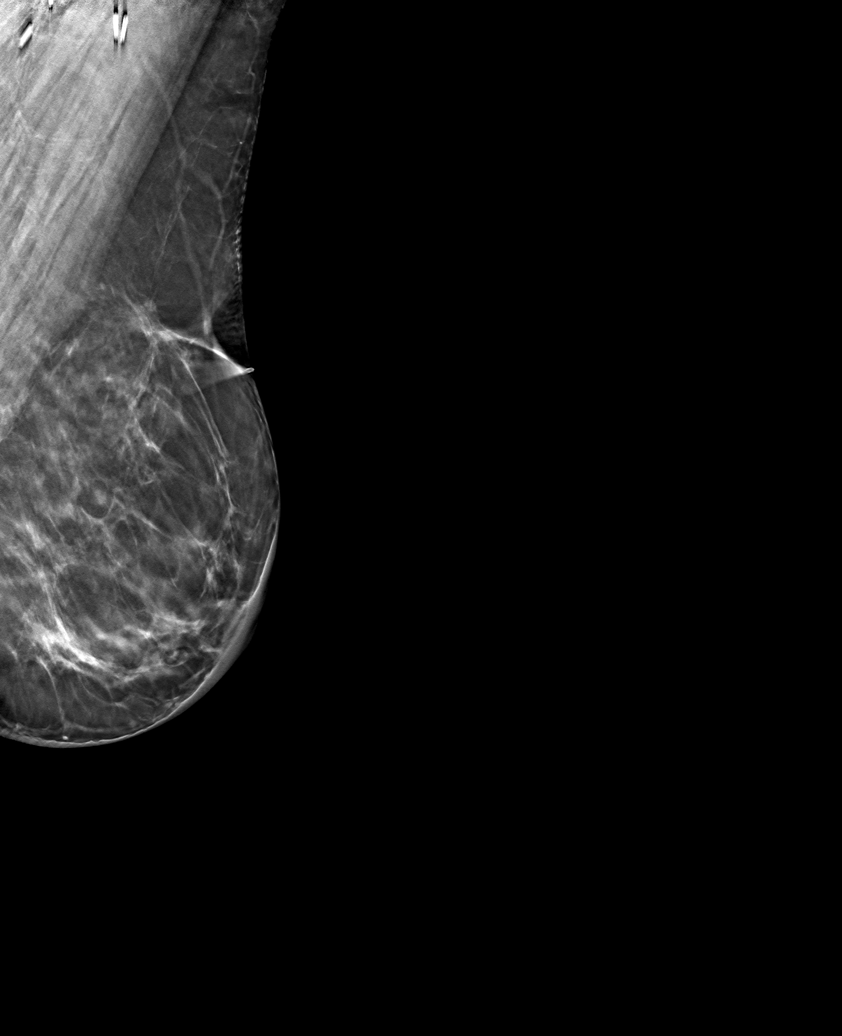

[6 of 30 positions shown; findings below may reference images not displayed]

ACR Breast Density Category b: There are scattered areas of
fibroglandular density.
FINDINGS: There are no findings suspicious for malignancy. LEFT surgical
changes again noted.
IMPRESSION: No mammographic evidence of malignancy. A result letter of this
screening mammogram will be mailed directly to the patient.

RECOMMENDATION:
Screening mammogram in one year. (Code:S0-D-Z9N)

BI-RADS CATEGORY  2: Benign.

## 2023-03-10 ENCOUNTER — Encounter: Payer: Self-pay | Admitting: *Deleted

## 2023-05-28 ENCOUNTER — Other Ambulatory Visit: Payer: Self-pay | Admitting: Physician Assistant

## 2023-05-28 DIAGNOSIS — Z1231 Encounter for screening mammogram for malignant neoplasm of breast: Secondary | ICD-10-CM

## 2023-07-13 ENCOUNTER — Ambulatory Visit
Admission: RE | Admit: 2023-07-13 | Discharge: 2023-07-13 | Disposition: A | Discharge status: Home / Self Care | Payer: BC Managed Care – PPO | Source: Ambulatory Visit | Attending: Physician Assistant | Admitting: Physician Assistant

## 2023-07-13 DIAGNOSIS — Z1231 Encounter for screening mammogram for malignant neoplasm of breast: Secondary | ICD-10-CM

## 2023-07-14 ENCOUNTER — Other Ambulatory Visit: Payer: Self-pay | Admitting: Physician Assistant

## 2023-07-14 DIAGNOSIS — M7989 Other specified soft tissue disorders: Secondary | ICD-10-CM

## 2023-07-14 DIAGNOSIS — R599 Enlarged lymph nodes, unspecified: Secondary | ICD-10-CM

## 2023-08-05 ENCOUNTER — Ambulatory Visit
Admission: RE | Admit: 2023-08-05 | Discharge: 2023-08-05 | Disposition: A | Discharge status: Home / Self Care | Payer: 59 | Source: Ambulatory Visit | Attending: Physician Assistant | Admitting: Physician Assistant

## 2023-08-05 DIAGNOSIS — M7989 Other specified soft tissue disorders: Secondary | ICD-10-CM

## 2023-08-05 DIAGNOSIS — R599 Enlarged lymph nodes, unspecified: Secondary | ICD-10-CM

## 2024-01-04 ENCOUNTER — Other Ambulatory Visit: Payer: Self-pay

## 2024-01-04 ENCOUNTER — Emergency Department (HOSPITAL_BASED_OUTPATIENT_CLINIC_OR_DEPARTMENT_OTHER)

## 2024-01-04 ENCOUNTER — Encounter (HOSPITAL_BASED_OUTPATIENT_CLINIC_OR_DEPARTMENT_OTHER): Payer: Self-pay

## 2024-01-04 ENCOUNTER — Emergency Department (HOSPITAL_BASED_OUTPATIENT_CLINIC_OR_DEPARTMENT_OTHER)
Admission: EM | Admit: 2024-01-04 | Discharge: 2024-01-04 | Disposition: A | Discharge status: Home / Self Care | Attending: Emergency Medicine | Admitting: Emergency Medicine

## 2024-01-04 DIAGNOSIS — R600 Localized edema: Secondary | ICD-10-CM | POA: Insufficient documentation

## 2024-01-04 DIAGNOSIS — Z85828 Personal history of other malignant neoplasm of skin: Secondary | ICD-10-CM | POA: Diagnosis not present

## 2024-01-04 DIAGNOSIS — Z7982 Long term (current) use of aspirin: Secondary | ICD-10-CM | POA: Insufficient documentation

## 2024-01-04 DIAGNOSIS — R0789 Other chest pain: Secondary | ICD-10-CM | POA: Insufficient documentation

## 2024-01-04 DIAGNOSIS — Z853 Personal history of malignant neoplasm of breast: Secondary | ICD-10-CM | POA: Insufficient documentation

## 2024-01-04 LAB — COMPREHENSIVE METABOLIC PANEL WITH GFR
ALT: 38 U/L (ref 0–44)
AST: 32 U/L (ref 15–41)
Albumin: 4.6 g/dL (ref 3.5–5.0)
Alkaline Phosphatase: 64 U/L (ref 38–126)
Anion gap: 15 (ref 5–15)
BUN: 8 mg/dL (ref 8–23)
CO2: 24 mmol/L (ref 22–32)
Calcium: 10.2 mg/dL (ref 8.9–10.3)
Chloride: 102 mmol/L (ref 98–111)
Creatinine, Ser: 0.86 mg/dL (ref 0.44–1.00)
GFR, Estimated: >60 mL/min (ref >60)
Glucose, Bld: 93 mg/dL (ref 70–99)
Potassium: 4 mmol/L (ref 3.5–5.1)
Sodium: 141 mmol/L (ref 135–145)
Total Bilirubin: 0.5 mg/dL (ref 0.0–1.2)
Total Protein: 7.3 g/dL (ref 6.5–8.1)

## 2024-01-04 LAB — CBC
HCT: 37.7 % (ref 36.0–46.0)
Hemoglobin: 12.5 g/dL (ref 12.0–15.0)
MCH: 31.5 pg (ref 26.0–34.0)
MCHC: 33.2 g/dL (ref 30.0–36.0)
MCV: 95 fL (ref 80.0–100.0)
Platelets: 296 10*3/uL (ref 150–400)
RBC: 3.97 MIL/uL (ref 3.87–5.11)
RDW: 12.4 % (ref 11.5–15.5)
WBC: 6.6 10*3/uL (ref 4.0–10.5)
nRBC: 0 % (ref 0.0–0.2)

## 2024-01-04 LAB — TROPONIN T, HIGH SENSITIVITY: Troponin T High Sensitivity: <15 ng/L (ref <19)

## 2024-01-04 NOTE — ED Provider Notes (Signed)
  EMERGENCY DEPARTMENT AT [facility] Provider Note   CSN: [id number] Arrival date & time: 01/04/24  1313     History  Chief Complaint  Patient presents with   Chest Pain    Alexa Kelley is a 63 y.o. female history of hyperlipidemia, breast cancer status postlumpectomy and chemo in remission x 14 years presents with complaints of chest comfort described as " a gentle squishing".  Some started yesterday.  Describes difficulty taking a full breath.  Denies dizziness.  No nausea or vomiting.  Took an Advil with some improvement of her symptoms.  No exertional component.  Pain is localized to the left side of her chest.  Today she states she started having discomfort in her left shoulder blade as well.  Has a history of PVCs.  No prior blood clots.   Chest Pain     Past Medical History:  Diagnosis Date   Breast cancer (HCC)    Hyperlipidemia    Personal history of chemotherapy    Personal history of radiation therapy    Skin cancer    Past Surgical History:  Procedure Laterality Date   BREAST LUMPECTOMY Left 2010   left breast    BUNIONECTOMY Left    POLYPECTOMY       Home Medications Prior to Admission medications   Medication Sig Start Date End Date Taking? Authorizing Provider  aspirin 81 MG chewable tablet Chew 81 mg by mouth daily.    [provider]  azithromycin  (ZITHROMAX ) 250 MG tablet Take 2 tablets by mouth on first day then 1 tablet by mouth for next 4 days 11/04/22   Aida Alexander A, DO  cholecalciferol (VITAMIN D ) 1000 UNITS tablet Take 1,000 Units by mouth 2 (two) times daily.    [provider]  COCONUT OIL PO Take 1 capsule by mouth every morning.    [provider]  fish oil-omega-3 fatty acids 1000 MG capsule Take 2 g by mouth daily.    [provider]  lisinopril (ZESTRIL) 20 MG tablet TAKE 1 TABLET(20 MG) BY MOUTH DAILY 02/26/20   [provider]  lisinopril (ZESTRIL) 20 MG tablet Take  20 mg by mouth daily. 05/28/20   [provider]  LORazepam (ATIVAN) 1 MG tablet 0.5 mg at bedtime.  12/03/12   [provider]  metFORMIN (GLUCOPHAGE) 500 MG tablet Take 500 mg by mouth 2 (two) times daily. 05/27/20   [provider]  methylPREDNISolone  (MEDROL  DOSEPAK) 4 MG TBPK tablet Take as directed 05/31/20   Stover, Titorya, DPM  Multiple Vitamins-Minerals (MULTIVITAMIN WITH MINERALS) tablet Take 1 tablet by mouth 2 (two) times a week.    [provider]  propranolol ER (INDERAL LA) 80 MG 24 hr capsule Take 1 capsule by mouth 2 (two) times daily. 05/26/17   [provider]  simvastatin (ZOCOR) 40 MG tablet  11/25/12   [provider]  venlafaxine  XR (EFFEXOR -XR) 37.5 MG 24 hr capsule Take 1 capsule (37.5 mg total) by mouth daily. 11/11/13   Percival Brace, NP  vitamin C (ASCORBIC ACID) 500 MG tablet Take 500 mg by mouth daily.    [provider]  vitamin E 400 UNIT capsule Take 400 Units by mouth daily.    [provider]      Allergies    Codeine    Review of Systems   Review of Systems  Cardiovascular:  Positive for chest pain.    Physical Exam Updated Vital Signs BP Aaron Aas)  146/97   Pulse (!) 53   Temp (!) 97.5 F (36.4 C)   Resp 16   SpO2 99%  Physical Exam Vitals and nursing note reviewed.  Constitutional:      General: She is not in acute distress.    Appearance: She is well-developed.  HENT:     Head: Normocephalic and atraumatic.  Eyes:     Conjunctiva/sclera: Conjunctivae normal.  Cardiovascular:     Rate and Rhythm: Normal rate and regular rhythm.     Heart sounds: No murmur heard. Pulmonary:     Effort: Pulmonary effort is normal. No respiratory distress.     Breath sounds: Normal breath sounds.  Abdominal:     Palpations: Abdomen is soft.     Tenderness: There is no abdominal tenderness.  Musculoskeletal:        General: No swelling.     Cervical back: Neck supple.  Skin:     General: Skin is warm and dry.     Capillary Refill: Capillary refill takes less than 2 seconds.  Neurological:     Mental Status: She is alert.  Psychiatric:        Mood and Affect: Mood normal.     ED Results / Procedures / Treatments   Labs (all labs ordered are listed, but only abnormal results are displayed) Labs Reviewed  CBC  COMPREHENSIVE METABOLIC PANEL WITH GFR  TROPONIN T, HIGH SENSITIVITY    EKG EKG Interpretation Date/Time:  Monday January 04 2024 13:21:43 EDT Ventricular Rate:  58 PR Interval:  158 QRS Duration:  82 QT Interval:  414 QTC Calculation: 406 R Axis:   69  Text Interpretation: Sinus bradycardia Cannot rule out Anterior infarct , age undetermined Abnormal ECG No previous ECGs available Confirmed by Rosealee Concha ([id number]) on 01/04/2024 1:23:21 PM  Radiology DG Chest Port 1 View Result Date: 01/04/2024 CLINICAL DATA:  Chest pain. EXAM: PORTABLE CHEST 1 VIEW COMPARISON:  None Available. FINDINGS: The heart size and mediastinal contours are within normal limits. No focal consolidation, sizeable pleural effusion, or pneumothorax. Surgical clips in the left axilla. No acute osseous abnormality. IMPRESSION: No acute cardiopulmonary findings. Electronically Signed   By: Mannie Seek M.D.   On: 01/04/2024 15:36    Procedures Procedures    Medications Ordered in ED Medications - No data to display  ED Course/ Medical Decision Making/ A&P                                 Medical Decision Making  This patient presents to the ED with chief complaint(s) of chest discomfort.  The complaint involves an extensive differential diagnosis and also carries with it a high risk of complications and morbidity.   Pertinent past medical history as listed in HPI  The differential diagnosis includes  ACS, PE, pneumonia, musculoskeletal, aortic dissection, pulmonary embolism, pneumothorax Additional history obtained: Records reviewed Care Everywhere/External  Records  Assessment and management:   Patient presents hypertensive 178/96 with complaints of chest discomfort.  Denies pain but describes as " gentle's squishing".  Pain is localized to the left side of her chest and radiates to her left shoulder.  She has no URI symptoms to suggest pneumonia.  She is additionally afebrile with clear lung sounds.  She does have a history of PVCs.  No prior blood clots.  She does have a history of breast cancer but is 14 years in remission.  Recent mammogram reassuring.  She does have bilateral lower extremity trace edema.  No tenderness.  No reproducible chest wall tenderness.  She did have some improvement with taking Advil yesterday.  Her symptoms are not exertional.  Patient is resting comfortably in bed with symmetric pulses with overall reassuring vital signs, lower suspicion for aortic dissection.   Workup is overall reassuring.  Heart score is low.  Discussed discharge plan with patient she is agreeable and understanding.     Independent ECG interpretation:  Sinus bradycardia  Independent labs interpretation:  The following labs were independently interpreted:  CBC and CMP unremarkable, troponin without elevation  Independent visualization and interpretation of imaging: I independently visualized the following imaging with scope of interpretation limited to determining acute life threatening conditions related to emergency care:  CXR no acute cardiopulmonary disease  Consultations obtained:   none  Disposition:   Patient will be discharged home. The patient has been appropriately medically screened and/or stabilized in the ED. I have low suspicion for any other emergent medical condition which would require further screening, evaluation or treatment in the ED or require inpatient management. At time of discharge the patient is hemodynamically stable and in no acute distress. I have discussed work-up results and diagnosis with patient and answered all  questions. Patient is agreeable with discharge plan. We discussed strict return precautions for returning to the emergency department and they verbalized understanding.     Social Determinants of Health:   none  This note was dictated with voice recognition software.  Despite best efforts at proofreading, errors may have occurred which can change the documentation meaning.          Final Clinical Impression(s) / ED Diagnoses Final diagnoses:  Atypical chest pain    Rx / DC Orders ED Discharge Orders     None         Felicie Horning, PA-C 01/04/24 1551    Rosealee Concha, MD 01/04/24 1810

## 2024-01-04 NOTE — Discharge Instructions (Signed)
 You were evaluated in the emergency room for chest discomfort.  Your lab work, EKG and chest x-ray showed no significant findings.  If your symptoms persist please follow-up with your primary care doctor.  If you experience any new or worsening symptoms including worsening chest pain, shortness of breath, dizziness please return to the emergency room.

## 2024-01-04 NOTE — ED Triage Notes (Signed)
 Pt c/o L sided chest pressure, associated SHOB onset yesterday. States pressure goes "all the way through to my shoulder blade." Denies NV, radiation into arm/ jaw.   Hx PVC's

## 2024-05-20 ENCOUNTER — Ambulatory Visit: Attending: Physician Assistant | Admitting: Physical Therapy

## 2024-05-20 ENCOUNTER — Encounter: Payer: Self-pay | Admitting: Physical Therapy

## 2024-05-20 ENCOUNTER — Other Ambulatory Visit: Payer: Self-pay

## 2024-05-20 DIAGNOSIS — R2689 Other abnormalities of gait and mobility: Secondary | ICD-10-CM | POA: Diagnosis present

## 2024-05-20 DIAGNOSIS — M25562 Pain in left knee: Secondary | ICD-10-CM | POA: Insufficient documentation

## 2024-05-20 DIAGNOSIS — M6281 Muscle weakness (generalized): Secondary | ICD-10-CM | POA: Diagnosis present

## 2024-05-20 DIAGNOSIS — G8929 Other chronic pain: Secondary | ICD-10-CM | POA: Diagnosis present

## 2024-05-20 NOTE — Therapy (Signed)
 OUTPATIENT PHYSICAL THERAPY LOWER EXTREMITY EVALUATION   Patient Name: Alexa Kelley MRN: [id number] DOB:1961-01-22, 63 y.o., female Today's Date: 05/20/2024  END OF SESSION:  Alexa Kelley End of Session - 05/20/24 0920     Visit Number 1    Number of Visits 6    Date for Recertification  07/01/24    Authorization Type [employer]    Alexa Kelley Start Time [phone]    Alexa Kelley Stop Time 0921    Alexa Kelley Time Calculation (min) 35 min    Activity Tolerance Patient tolerated treatment well          Past Medical History:  Diagnosis Date   Breast cancer (HCC)    Hyperlipidemia    Personal history of chemotherapy    Personal history of radiation therapy    Skin cancer    Past Surgical History:  Procedure Laterality Date   BREAST LUMPECTOMY Left 2010   left breast    BUNIONECTOMY Left    POLYPECTOMY     Patient Active Problem List   Diagnosis Date Noted   Left elbow pain 11/04/2022   Essential hypertension 01/02/2017   History of breast cancer 01/02/2017   IFG (impaired fasting glucose) 01/02/2017   Major depression, recurrent, chronic 01/02/2017   Mixed hyperlipidemia 01/02/2017   Breast cancer of lower-outer quadrant of left female breast (HCC) 11/10/2014   Pain of upper abdomen 11/10/2014   Lymphedema 11/10/2014   Transaminitis 11/10/2014   Hyperglycemia 11/10/2014    PCP: Not seen on chart  REFERRING PROVIDER: Donata Snowman, PA-C  REFERRING DIAG: M23.52 (ICD-10-CM) - Recurrent left knee instability  THERAPY DIAG:  Chronic pain of left knee  Muscle weakness (generalized)  Other abnormalities of gait and mobility  Rationale for Evaluation and Treatment: Rehabilitation  ONSET DATE: ~2 weeks ago flare up  SUBJECTIVE:   SUBJECTIVE STATEMENT: Alexa Kelley states 6-7 years ago she jumped and tore her ACL while walking. Her knee has not been as stable. Now, she is having some increased difficulty with negotiating stairs if she steps up with her left leg first without thinking. Has been doing sit  to stands and walking daily. Has now been avoiding stairs.  PERTINENT HISTORY:  Alexa Kelley does report she currently has a broken L toe from dropping a bottle of oil on it  PAIN:  Are you having pain? Yes: NPRS scale: 0 currently, at worst 4-5 Pain location: anterior knee Pain description: whole knee will ache for a couple of days Aggravating factors: Stairs Relieving factors: knee brace  PRECAUTIONS: None  RED FLAGS: None   WEIGHT BEARING RESTRICTIONS: No  FALLS:  Has patient fallen in last 6 months? No  LIVING ENVIRONMENT: Lives with: lives alone Lives in: House/apartment Has following equipment at home: None  OCCUPATION: Nurse at [employer]  PLOF: Independent  PATIENT GOALS: Be able to step up on L LE without pain  NEXT MD VISIT: n/a  OBJECTIVE:  Note: Objective measures were completed at Evaluation unless otherwise noted.  DIAGNOSTIC FINDINGS: no new imaging of her knee seen on Epic  PATIENT SURVEYS:  LEFS  Extreme difficulty/unable (0), Quite a bit of difficulty (1), Moderate difficulty (2), Little difficulty (3), No difficulty (4) Survey date:  05/20/24  Any of your usual work, housework or school activities 4  2. Usual hobbies, recreational or sporting activities 3  3. Getting into/out of the bath 2  4. Walking between rooms 4  5. Putting on socks/shoes 4  6. Squatting  1  7. Lifting an object, like a  bag of groceries from the floor 4  8. Performing light activities around your home 4  9. Performing heavy activities around your home 4  10. Getting into/out of a car 4  11. Walking 2 blocks 4  12. Walking 1 mile 4  13. Going up/down 10 stairs (1 flight) 2  14. Standing for 1 hour 4  15.  sitting for 1 hour 4  16. Running on even ground 2  17. Running on uneven ground 2  18. Making sharp turns while running fast 2  19. Hopping  2  20. Rolling over in bed 4  Score total:  64/80     COGNITION: Overall cognitive status: Within functional limits for tasks  assessed     SENSATION: WFL  EDEMA:  None  MUSCLE LENGTH: Did not assess  POSTURE: No Significant postural limitations  PALPATION: No overt tenderness to palpation except some tenderness along L patellar tendon  LOWER EXTREMITY ROM: Appears WNL   LOWER EXTREMITY MMT:  MMT Right eval Left eval  Hip flexion 3+ 3+  Hip extension 3 3  Hip abduction 4- 3+  Hip adduction 4- 3+  Hip internal rotation 4 3+  Hip external rotation 4 3+  Knee flexion 5 3+  Knee extension 4 4  Ankle dorsiflexion    Ankle plantarflexion    Ankle inversion    Ankle eversion     (Blank rows = not tested)  LOWER EXTREMITY SPECIAL TESTS:  Did not assess  FUNCTIONAL TESTS:  Did not assess  GAIT: Distance walked: Into clinic Assistive device utilized: None Level of assistance: Complete Independence Comments: Normal reciprocal pattern                                                                                                                                TREATMENT DATE: 05/20/24 See HEP below    PATIENT EDUCATION:  Education details: Exam findings, POC, initial HEP Person educated: Patient Education method: Explanation, Demonstration, and Handouts Education comprehension: verbalized understanding, returned demonstration, and needs further education  HOME EXERCISE PROGRAM: Access Code: [email or web address] URL: [email or web address] Date: 05/20/2024 Prepared by: Bernadette Gores April Earnie Starring  Exercises (with yellow TB) - Side Stepping with Resistance at Feet  - 1 x daily - 7 x weekly - 3 sets - 10 reps - Forward Monster Walks  - 1 x daily - 7 x weekly - 3 sets - 10 reps - Backward Monster Walks  - 1 x daily - 7 x weekly - 3 sets - 10 reps - Long Sitting Straight Leg Raise with External Rotation  - 1 x daily - 7 x weekly - 3 sets - 10 reps  ASSESSMENT:  CLINICAL IMPRESSION: Patient is a 63 y.o. F who was seen today for physical therapy evaluation and treatment for L knee  pain. PMH is significant for ACL tear (Alexa Kelley states she thinks she doesn't have one anymore) and currently has a L broken toe.  Assessment demos L>R LE weakness - especially in her hips likely leading to decreased knee stability with steps. Alexa Kelley will benefit from Alexa Kelley to address these deficits to maximize her level of function.   OBJECTIVE IMPAIRMENTS: Abnormal gait, decreased activity tolerance, decreased coordination, decreased endurance, decreased mobility, decreased strength, increased fascial restrictions, increased muscle spasms, improper body mechanics, postural dysfunction, and pain.   ACTIVITY LIMITATIONS: carrying, lifting, squatting, stairs, transfers, and locomotion level  PARTICIPATION LIMITATIONS: community activity and occupation  PERSONAL FACTORS: Age, Fitness, Past/current experiences, and Time since onset of injury/illness/exacerbation are also affecting patient's functional outcome.   REHAB POTENTIAL: Good  CLINICAL DECISION MAKING: Stable/uncomplicated  EVALUATION COMPLEXITY: Low   GOALS: Goals reviewed with patient? Yes  SHORT TERM GOALS: Target date: 06/10/2024  Alexa Kelley will be ind with HEP Baseline: Goal status: INITIAL   LONG TERM GOALS: Target date: 07/01/2024   Alexa Kelley will be ind with management and progression of HEP Baseline:  Goal status: INITIAL  2.  Alexa Kelley will be able to ascend/descend 10 stairs without handrail and reciprocal pattern without pain Baseline:  Goal status: INITIAL  3.  Alexa Kelley will have improved LEFS score to >/=73/80 to demo MCID Baseline:  Goal status: INITIAL  4.  Alexa Kelley will demo at least 4/5 LE strength bilat for improved stability Baseline:  Goal status: INITIAL    PLAN:  Alexa Kelley FREQUENCY: 1x/week  Alexa Kelley DURATION: 6 weeks  PLANNED INTERVENTIONS: [id number]- Alexa Kelley Re-evaluation, 97110-Therapeutic exercises, 97530- Therapeutic activity, [id number]- Neuromuscular re-education, 97535- Self Care, [id number]- Manual therapy, [id number]- Gait training, [phone]- Electrical  stimulation (unattended), 97016- Vasopneumatic device, [id number]- Ultrasound, 02966- Ionotophoresis 4mg /ml Dexamethasone, Patient/Family education, Balance training, Stair training, Taping, Joint mobilization, Cryotherapy, and Moist heat  PLAN FOR NEXT SESSION: Assess response to HEP and update accordingly. Strengthen hips. Eccentric quad strengthening. Work on [employer].    Oriah Leinweber April Ma L Alphonzo Devera, Alexa Kelley, DPT 05/20/2024, 9:45 AM

## 2024-05-27 ENCOUNTER — Ambulatory Visit: Admitting: Physical Therapy

## 2024-05-27 ENCOUNTER — Encounter: Payer: Self-pay | Admitting: Physical Therapy

## 2024-05-27 DIAGNOSIS — M6281 Muscle weakness (generalized): Secondary | ICD-10-CM

## 2024-05-27 DIAGNOSIS — R2689 Other abnormalities of gait and mobility: Secondary | ICD-10-CM

## 2024-05-27 DIAGNOSIS — G8929 Other chronic pain: Secondary | ICD-10-CM

## 2024-05-27 DIAGNOSIS — M25562 Pain in left knee: Secondary | ICD-10-CM | POA: Diagnosis not present

## 2024-05-27 NOTE — Therapy (Signed)
 OUTPATIENT PHYSICAL THERAPY TREATMENT   Patient Name: Alexa Kelley MRN: [id number] DOB:Nov 24, 1960, 63 y.o., female Today's Date: 05/27/2024  END OF SESSION:  PT End of Session - 05/27/24 0844     Visit Number 2    Number of Visits 6    Date for Recertification  07/01/24    Authorization Type [employer]    PT Start Time 0845    PT Stop Time 0925    PT Time Calculation (min) 40 min    Activity Tolerance Patient tolerated treatment well           Past Medical History:  Diagnosis Date   Breast cancer (HCC)    Hyperlipidemia    Personal history of chemotherapy    Personal history of radiation therapy    Skin cancer    Past Surgical History:  Procedure Laterality Date   BREAST LUMPECTOMY Left 2010   left breast    BUNIONECTOMY Left    POLYPECTOMY     Patient Active Problem List   Diagnosis Date Noted   Left elbow pain 11/04/2022   Essential hypertension 01/02/2017   History of breast cancer 01/02/2017   IFG (impaired fasting glucose) 01/02/2017   Major depression, recurrent, chronic 01/02/2017   Mixed hyperlipidemia 01/02/2017   Breast cancer of lower-outer quadrant of left female breast (HCC) 11/10/2014   Pain of upper abdomen 11/10/2014   Lymphedema 11/10/2014   Transaminitis 11/10/2014   Hyperglycemia 11/10/2014    PCP: Not seen on chart  REFERRING PROVIDER: Donata Snowman, PA-C  REFERRING DIAG: M23.52 (ICD-10-CM) - Recurrent left knee instability  THERAPY DIAG:  Chronic pain of left knee  Muscle weakness (generalized)  Other abnormalities of gait and mobility  Rationale for Evaluation and Treatment: Rehabilitation  ONSET DATE: ~2 weeks ago flare up  SUBJECTIVE:   SUBJECTIVE STATEMENT: Pt states she was able to do her exercises 3 times. Has not hurt her knee since last visit. Still leading with the R.   From eval: Pt states 6-7 years ago she jumped and tore her ACL while walking. Her knee has not been as stable. Now, she is having some  increased difficulty with negotiating stairs if she steps up with her left leg first without thinking. Has been doing sit to stands and walking daily. Has now been avoiding stairs.  PERTINENT HISTORY:  Pt does report she currently has a broken L toe from dropping a bottle of oil on it  PAIN:  Are you having pain? Yes: NPRS scale: 0 currently, at worst 4-5 Pain location: anterior knee Pain description: whole knee will ache for a couple of days Aggravating factors: Stairs Relieving factors: knee brace  PRECAUTIONS: None  RED FLAGS: None   WEIGHT BEARING RESTRICTIONS: No  FALLS:  Has patient fallen in last 6 months? No  LIVING ENVIRONMENT: Lives with: lives alone Lives in: House/apartment Has following equipment at home: None  OCCUPATION: Nurse at [employer]  PLOF: Independent  PATIENT GOALS: Be able to step up on L LE without pain  NEXT MD VISIT: n/a  OBJECTIVE:  Note: Objective measures were completed at Evaluation unless otherwise noted.  DIAGNOSTIC FINDINGS: no new imaging of her knee seen on Epic  PATIENT SURVEYS:  LEFS  Extreme difficulty/unable (0), Quite a bit of difficulty (1), Moderate difficulty (2), Little difficulty (3), No difficulty (4) Survey date:  05/20/24  Any of your usual work, housework or school activities 4  2. Usual hobbies, recreational or sporting activities 3  3. Getting into/out of  the bath 2  4. Walking between rooms 4  5. Putting on socks/shoes 4  6. Squatting  1  7. Lifting an object, like a bag of groceries from the floor 4  8. Performing light activities around your home 4  9. Performing heavy activities around your home 4  10. Getting into/out of a car 4  11. Walking 2 blocks 4  12. Walking 1 mile 4  13. Going up/down 10 stairs (1 flight) 2  14. Standing for 1 hour 4  15.  sitting for 1 hour 4  16. Running on even ground 2  17. Running on uneven ground 2  18. Making sharp turns while running fast 2  19. Hopping  2  20. Rolling  over in bed 4  Score total:  64/80     COGNITION: Overall cognitive status: Within functional limits for tasks assessed     SENSATION: WFL  EDEMA:  None  MUSCLE LENGTH: Did not assess  POSTURE: No Significant postural limitations  PALPATION: No overt tenderness to palpation except some tenderness along L patellar tendon  LOWER EXTREMITY ROM: Appears WNL   LOWER EXTREMITY MMT:  MMT Right eval Left eval  Hip flexion 3+ 3+  Hip extension 3 3  Hip abduction 4- 3+  Hip adduction 4- 3+  Hip internal rotation 4 3+  Hip external rotation 4 3+  Knee flexion 5 3+  Knee extension 4 4  Ankle dorsiflexion    Ankle plantarflexion    Ankle inversion    Ankle eversion     (Blank rows = not tested)  LOWER EXTREMITY SPECIAL TESTS:  Did not assess  FUNCTIONAL TESTS:  Did not assess  GAIT: Distance walked: Into clinic Assistive device utilized: None Level of assistance: Complete Independence Comments: Normal reciprocal pattern                                                                                                                                TREATMENT DATE:  05/27/24 Nustep L4 x 6 min UEs/LEs Side step xband walk red TB 2x10 Fwd/bwd monster walk red TB 2x10 Tandem stance toe touch with L 2x30 Standing SLR 2x10 red TB Standing hamstring curl red TB 2x10 Standing wall squat 2x10 Standing glute set iso press down on 4 step x10 Standing fwd step up 4 step x10  05/20/24 See HEP below    PATIENT EDUCATION:  Education details: Exam findings, POC, initial HEP Person educated: Patient Education method: Explanation, Demonstration, and Handouts Education comprehension: verbalized understanding, returned demonstration, and needs further education  HOME EXERCISE PROGRAM: Access Code: [email or web address] URL: [email or web address] Date: 05/20/2024 Prepared by: Ezzard Ditmer April Earnie Starring  Exercises (with yellow TB) - Side Stepping with Resistance at  Feet  - 1 x daily - 7 x weekly - 3 sets - 10 reps - Forward Monster Walks  - 1 x daily - 7 x weekly - 3 sets - 10 reps - Backward  Monster Walks  - 1 x daily - 7 x weekly - 3 sets - 10 reps - Long Sitting Straight Leg Raise with External Rotation  - 1 x daily - 7 x weekly - 3 sets - 10 reps  ASSESSMENT:  CLINICAL IMPRESSION: Treatment focused on reviewing and progressing pt's HEP. Able to work on improving L LE stability, strength and weight shift/weight bearing this session with good pt tolerance. Pt was apprehensive with wall squat but she was able to perform well without any issues.   From eval: Patient is a 63 y.o. F who was seen today for physical therapy evaluation and treatment for L knee pain. PMH is significant for ACL tear (pt states she thinks she doesn't have one anymore) and currently has a L broken toe. Assessment demos L>R LE weakness - especially in her hips likely leading to decreased knee stability with steps. Pt will benefit from PT to address these deficits to maximize her level of function.   OBJECTIVE IMPAIRMENTS: Abnormal gait, decreased activity tolerance, decreased coordination, decreased endurance, decreased mobility, decreased strength, increased fascial restrictions, increased muscle spasms, improper body mechanics, postural dysfunction, and pain.   ACTIVITY LIMITATIONS: carrying, lifting, squatting, stairs, transfers, and locomotion level  PARTICIPATION LIMITATIONS: community activity and occupation  PERSONAL FACTORS: Age, Fitness, Past/current experiences, and Time since onset of injury/illness/exacerbation are also affecting patient's functional outcome.   REHAB POTENTIAL: Good  CLINICAL DECISION MAKING: Stable/uncomplicated  EVALUATION COMPLEXITY: Low   GOALS: Goals reviewed with patient? Yes  SHORT TERM GOALS: Target date: 06/10/2024  Pt will be ind with HEP Baseline: Goal status: INITIAL   LONG TERM GOALS: Target date: 07/01/2024   Pt will be  ind with management and progression of HEP Baseline:  Goal status: INITIAL  2.  Pt will be able to ascend/descend 10 stairs without handrail and reciprocal pattern without pain Baseline:  Goal status: INITIAL  3.  Pt will have improved LEFS score to >/=73/80 to demo MCID Baseline:  Goal status: INITIAL  4.  Pt will demo at least 4/5 LE strength bilat for improved stability Baseline:  Goal status: INITIAL    PLAN:  PT FREQUENCY: 1x/week  PT DURATION: 6 weeks  PLANNED INTERVENTIONS: 97164- PT Re-evaluation, 97110-Therapeutic exercises, 97530- Therapeutic activity, [id number]- Neuromuscular re-education, 97535- Self Care, [id number]- Manual therapy, [id number]- Gait training, [phone]- Electrical stimulation (unattended), 97016- Vasopneumatic device, [id number]- Ultrasound, [id number]- Ionotophoresis 4mg /ml Dexamethasone, Patient/Family education, Balance training, Stair training, Taping, Joint mobilization, Cryotherapy, and Moist heat  PLAN FOR NEXT SESSION: Assess response to HEP and update accordingly. Strengthen hips. Eccentric quad strengthening. Work on capital one.    Courtland Coppa April Ma L Deonne Rooks, PT, DPT 05/27/2024, 8:44 AM

## 2024-06-03 ENCOUNTER — Ambulatory Visit: Attending: Physician Assistant | Admitting: Physical Therapy

## 2024-06-03 ENCOUNTER — Encounter: Payer: Self-pay | Admitting: Physical Therapy

## 2024-06-03 DIAGNOSIS — M25562 Pain in left knee: Secondary | ICD-10-CM | POA: Insufficient documentation

## 2024-06-03 DIAGNOSIS — M6281 Muscle weakness (generalized): Secondary | ICD-10-CM | POA: Diagnosis present

## 2024-06-03 DIAGNOSIS — R2689 Other abnormalities of gait and mobility: Secondary | ICD-10-CM | POA: Diagnosis present

## 2024-06-03 DIAGNOSIS — G8929 Other chronic pain: Secondary | ICD-10-CM | POA: Diagnosis present

## 2024-06-03 NOTE — Therapy (Signed)
 OUTPATIENT PHYSICAL THERAPY TREATMENT   Patient Name: Alexa Kelley MRN: [id number] DOB:07/25/1961, 63 y.o., female Today's Date: 06/03/2024  END OF SESSION:  PT End of Session - 06/03/24 0827     Visit Number 3    Number of Visits 6    Date for Recertification  07/01/24    Authorization Type [employer]et[employer]    PT Start Time 0830    PT Stop Time 0910    PT Time Calculation (min) 40 min    Activity Tolerance Patient tolerated treatment well            Past Medical History:  Diagnosis Date   Breast cancer (HCC)    Hyperlipidemia    Personal history of chemotherapy    Personal history of radiation therapy    Skin cancer    Past Surgical History:  Procedure Laterality Date   BREAST LUMPECTOMY Left 2010   left breast    BUNIONECTOMY Left    POLYPECTOMY     Patient Active Problem List   Diagnosis Date Noted   Left elbow pain 11/04/2022   Essential hypertension 01/02/2017   History of breast cancer 01/02/2017   IFG (impaired fasting glucose) 01/02/2017   Major depression, recurrent, chronic 01/02/2017   Mixed hyperlipidemia 01/02/2017   Breast cancer of lower-outer quadrant of left female breast (HCC) 11/10/2014   Pain of upper abdomen 11/10/2014   Lymphedema 11/10/2014   Transaminitis 11/10/2014   Hyperglycemia 11/10/2014    PCP: Not seen on chart  REFERRING PROVIDER: Donata Snowman, PA-C  REFERRING DIAG: M23.52 (ICD-10-CM) - Recurrent left knee instability  THERAPY DIAG:  Chronic pain of left knee  Muscle weakness (generalized)  Other abnormalities of gait and mobility  Rationale for Evaluation and Treatment: Rehabilitation  ONSET DATE: ~2 weeks ago flare up  SUBJECTIVE:   SUBJECTIVE STATEMENT: Pt reports her knee is feeling good.   From eval: Pt states 6-7 years ago she jumped and tore her ACL while walking. Her knee has not been as stable. Now, she is having some increased difficulty with negotiating stairs if she steps up with her left leg  first without thinking. Has been doing sit to stands and walking daily. Has now been avoiding stairs.  PERTINENT HISTORY:  Pt does report she currently has a broken L toe from dropping a bottle of oil on it  PAIN:  Are you having pain? Yes: NPRS scale: 0 currently, at worst 4-5 Pain location: anterior knee Pain description: whole knee will ache for a couple of days Aggravating factors: Stairs Relieving factors: knee brace  PRECAUTIONS: None  RED FLAGS: None   WEIGHT BEARING RESTRICTIONS: No  FALLS:  Has patient fallen in last 6 months? No  LIVING ENVIRONMENT: Lives with: lives alone Lives in: House/apartment Has following equipment at home: None  OCCUPATION: Nurse at [employer]  PLOF: Independent  PATIENT GOALS: Be able to step up on L LE without pain  NEXT MD VISIT: n/a  OBJECTIVE:  Note: Objective measures were completed at Evaluation unless otherwise noted.  DIAGNOSTIC FINDINGS: no new imaging of her knee seen on Epic  PATIENT SURVEYS:  LEFS  Extreme difficulty/unable (0), Quite a bit of difficulty (1), Moderate difficulty (2), Little difficulty (3), No difficulty (4) Survey date:  05/20/24  Any of your usual work, housework or school activities 4  2. Usual hobbies, recreational or sporting activities 3  3. Getting into/out of the bath 2  4. Walking between rooms 4  5. Putting on socks/shoes 4  6. Squatting  1  7. Lifting an object, like a bag of groceries from the floor 4  8. Performing light activities around your home 4  9. Performing heavy activities around your home 4  10. Getting into/out of a car 4  11. Walking 2 blocks 4  12. Walking 1 mile 4  13. Going up/down 10 stairs (1 flight) 2  14. Standing for 1 hour 4  15.  sitting for 1 hour 4  16. Running on even ground 2  17. Running on uneven ground 2  18. Making sharp turns while running fast 2  19. Hopping  2  20. Rolling over in bed 4  Score total:  64/80     COGNITION: Overall cognitive status:  Within functional limits for tasks assessed     SENSATION: WFL  EDEMA:  None  MUSCLE LENGTH: Did not assess  POSTURE: No Significant postural limitations  PALPATION: No overt tenderness to palpation except some tenderness along L patellar tendon  LOWER EXTREMITY ROM: Appears WNL   LOWER EXTREMITY MMT:  MMT Right eval Left eval  Hip flexion 3+ 3+  Hip extension 3 3  Hip abduction 4- 3+  Hip adduction 4- 3+  Hip internal rotation 4 3+  Hip external rotation 4 3+  Knee flexion 5 3+  Knee extension 4 4  Ankle dorsiflexion    Ankle plantarflexion    Ankle inversion    Ankle eversion     (Blank rows = not tested)  LOWER EXTREMITY SPECIAL TESTS:  Did not assess  FUNCTIONAL TESTS:  Did not assess  GAIT: Distance walked: Into clinic Assistive device utilized: None Level of assistance: Complete Independence Comments: Normal reciprocal pattern                                                                                                                                TREATMENT DATE:  06/03/24 Nustep L5 x 6 min UEs/LEs Standing gastroc stretch on slant board x 30 Standing soleus stretch on slant board x 30 Standing hamstring stretch x 30 Standing quad stretch x 30 Standing hip flexor stretch x 30 Runner's step up 4 step 2x10 Eccentric side step down 3x5 Single limb stance, 3 way hip slide on wash rag x10 Squat touch 2x10   05/27/24 Nustep L4 x 6 min UEs/LEs Side step xband walk red TB 2x10 Fwd/bwd monster walk red TB 2x10 Tandem stance toe touch with L 2x30 Standing SLR 2x10 red TB Standing hamstring curl red TB 2x10 Standing wall squat 2x10 Standing glute set iso press down on 4 step x10 Standing fwd step up 4 step x10  05/20/24 See HEP below    PATIENT EDUCATION:  Education details: Exam findings, POC, initial HEP Person educated: Patient Education method: Explanation, Demonstration, and Handouts Education comprehension: verbalized  understanding, returned demonstration, and needs further education  HOME EXERCISE PROGRAM: Access Code: [email or web address] URL: [email or web address] Date: 06/03/2024 Prepared by: Chariti Havel April  Earnie Starring  Exercises - Single Leg Balance with Clock Reach  - 1 x daily - 7 x weekly - 2 sets - 10 reps - Squat with Chair Touch  - 1 x daily - 7 x weekly - 2 sets - 10 reps - Runner's Step Up/Down  - 1 x daily - 7 x weekly - 2 sets - 10 reps - Standing Lateral Step-Down Heel Tap  - 1 x daily - 7 x weekly - 2 sets - 10 reps  ASSESSMENT:  CLINICAL IMPRESSION: Continued to progress pt into standing and full weight bearing on L LE. Able to work on building services engineer and gross L LE strength. Improving L LE stability with single limb activities. Updated pt's HEP accordingly. Will likely be able to progress towards full set of steps next session to meet pt's personal goals to negotiate stairs pain free.   From eval: Patient is a 63 y.o. F who was seen today for physical therapy evaluation and treatment for L knee pain. PMH is significant for ACL tear (pt states she thinks she doesn't have one anymore) and currently has a L broken toe. Assessment demos L>R LE weakness - especially in her hips likely leading to decreased knee stability with steps. Pt will benefit from PT to address these deficits to maximize her level of function.   OBJECTIVE IMPAIRMENTS: Abnormal gait, decreased activity tolerance, decreased coordination, decreased endurance, decreased mobility, decreased strength, increased fascial restrictions, increased muscle spasms, improper body mechanics, postural dysfunction, and pain.   ACTIVITY LIMITATIONS: carrying, lifting, squatting, stairs, transfers, and locomotion level  PARTICIPATION LIMITATIONS: community activity and occupation  PERSONAL FACTORS: Age, Fitness, Past/current experiences, and Time since onset of injury/illness/exacerbation are also affecting patient's functional  outcome.   REHAB POTENTIAL: Good  CLINICAL DECISION MAKING: Stable/uncomplicated  EVALUATION COMPLEXITY: Low   GOALS: Goals reviewed with patient? Yes  SHORT TERM GOALS: Target date: 06/10/2024  Pt will be ind with HEP Baseline: Goal status: INITIAL   LONG TERM GOALS: Target date: 07/01/2024   Pt will be ind with management and progression of HEP Baseline:  Goal status: INITIAL  2.  Pt will be able to ascend/descend 10 stairs without handrail and reciprocal pattern without pain Baseline:  Goal status: INITIAL  3.  Pt will have improved LEFS score to >/=73/80 to demo MCID Baseline:  Goal status: INITIAL  4.  Pt will demo at least 4/5 LE strength bilat for improved stability Baseline:  Goal status: INITIAL    PLAN:  PT FREQUENCY: 1x/week  PT DURATION: 6 weeks  PLANNED INTERVENTIONS: 97164- PT Re-evaluation, 97110-Therapeutic exercises, 97530- Therapeutic activity, [id number]- Neuromuscular re-education, 97535- Self Care, 02859- Manual therapy, [id number]- Gait training, [phone]- Electrical stimulation (unattended), 97016- Vasopneumatic device, [id number]- Ultrasound, 02966- Ionotophoresis 4mg /ml Dexamethasone, Patient/Family education, Balance training, Stair training, Taping, Joint mobilization, Cryotherapy, and Moist heat  PLAN FOR NEXT SESSION: Assess response to HEP and update accordingly. Strengthen hips. Eccentric quad strengthening. Progress to squats/lifting. Work on capital one.    Cally Nygard April Ma L [place] Winslow, PT, DPT 06/03/2024, 8:28 AM

## 2024-06-07 ENCOUNTER — Ambulatory Visit: Admitting: Physical Therapy

## 2024-06-10 ENCOUNTER — Ambulatory Visit: Admitting: Physical Therapy

## 2024-06-17 ENCOUNTER — Encounter: Payer: Self-pay | Admitting: Physical Therapy

## 2024-06-17 ENCOUNTER — Ambulatory Visit: Admitting: Physical Therapy

## 2024-06-17 DIAGNOSIS — G8929 Other chronic pain: Secondary | ICD-10-CM

## 2024-06-17 DIAGNOSIS — M25562 Pain in left knee: Secondary | ICD-10-CM | POA: Diagnosis not present

## 2024-06-17 DIAGNOSIS — R2689 Other abnormalities of gait and mobility: Secondary | ICD-10-CM

## 2024-06-17 DIAGNOSIS — M6281 Muscle weakness (generalized): Secondary | ICD-10-CM

## 2024-06-17 NOTE — Therapy (Signed)
 OUTPATIENT PHYSICAL THERAPY TREATMENT   Patient Name: Alexa Kelley MRN: [id number] DOB:[birth year], 63 y.o., female Today's Date: 06/17/2024  END OF SESSION:  PT End of Session - 06/17/24 0844     Visit Number 4    Number of Visits 6    Date for Recertification  07/01/24    Authorization Type [employer]    PT Start Time 0845    PT Stop Time 0925    PT Time Calculation (min) 40 min    Activity Tolerance Patient tolerated treatment well           Past Medical History:  Diagnosis Date   Breast cancer (HCC)    Hyperlipidemia    Personal history of chemotherapy    Personal history of radiation therapy    Skin cancer    Past Surgical History:  Procedure Laterality Date   BREAST LUMPECTOMY Left 2010   left breast    BUNIONECTOMY Left    POLYPECTOMY     Patient Active Problem List   Diagnosis Date Noted   Left elbow pain 11/04/2022   Essential hypertension 01/02/2017   History of breast cancer 01/02/2017   IFG (impaired fasting glucose) 01/02/2017   Major depression, recurrent, chronic 01/02/2017   Mixed hyperlipidemia 01/02/2017   Breast cancer of lower-outer quadrant of left female breast (HCC) 11/10/2014   Pain of upper abdomen 11/10/2014   Lymphedema 11/10/2014   Transaminitis 11/10/2014   Hyperglycemia 11/10/2014    PCP: Not seen on chart  REFERRING PROVIDER: Donata Snowman, PA-C  REFERRING DIAG: M23.52 (ICD-10-CM) - Recurrent left knee instability  THERAPY DIAG:  Chronic pain of left knee  Muscle weakness (generalized)  Other abnormalities of gait and mobility  Rationale for Evaluation and Treatment: Rehabilitation  ONSET DATE: ~2 weeks ago flare up  SUBJECTIVE:   SUBJECTIVE STATEMENT: Pt states she's been able to go up/down the stairs. No pain at all with mobility.   From eval: Pt states 6-7 years ago she jumped and tore her ACL while walking. Her knee has not been as stable. Now, she is having some increased difficulty with negotiating  stairs if she steps up with her left leg first without thinking. Has been doing sit to stands and walking daily. Has now been avoiding stairs.  PERTINENT HISTORY:  Pt does report she currently has a broken L toe from dropping a bottle of oil on it  PAIN:  Are you having pain? Yes: NPRS scale: 0 currently, at worst 4-5 Pain location: anterior knee Pain description: whole knee will ache for a couple of days Aggravating factors: Stairs Relieving factors: knee brace  PRECAUTIONS: None  RED FLAGS: None   WEIGHT BEARING RESTRICTIONS: No  FALLS:  Has patient fallen in last 6 months? No  LIVING ENVIRONMENT: Lives with: lives alone Lives in: House/apartment Has following equipment at home: None  OCCUPATION: Nurse at [employer]  PLOF: Independent  PATIENT GOALS: Be able to step up on L LE without pain  NEXT MD VISIT: n/a  OBJECTIVE:  Note: Objective measures were completed at Evaluation unless otherwise noted.  DIAGNOSTIC FINDINGS: no new imaging of her knee seen on Epic  PATIENT SURVEYS:  LEFS  Extreme difficulty/unable (0), Quite a bit of difficulty (1), Moderate difficulty (2), Little difficulty (3), No difficulty (4) Survey date:  05/20/24 06/17/24  Any of your usual work, housework or school activities 4 4  2. Usual hobbies, recreational or sporting activities 3 4  3. Getting into/out of the bath 2 4  4. Walking between rooms 4 4  5. Putting on socks/shoes 4 4  6. Squatting  1 3  7. Lifting an object, like a bag of groceries from the floor 4 4  8. Performing light activities around your home 4 4  9. Performing heavy activities around your home 4 4  10. Getting into/out of a car 4 4  11. Walking 2 blocks 4 4  12. Walking 1 mile 4 4  13. Going up/down 10 stairs (1 flight) 2 4  14. Standing for 1 hour 4 4  15.  sitting for 1 hour 4 4  16. Running on even ground 2 3  17. Running on uneven ground 2 3  18. Making sharp turns while running fast 2 3  19. Hopping  2 2  20.  Rolling over in bed 4 4  Score total:  64/80 74/80     COGNITION: Overall cognitive status: Within functional limits for tasks assessed     SENSATION: WFL  EDEMA:  None  MUSCLE LENGTH: Did not assess  POSTURE: No Significant postural limitations  PALPATION: No overt tenderness to palpation except some tenderness along L patellar tendon  LOWER EXTREMITY ROM: Appears WNL   LOWER EXTREMITY MMT:  MMT Right eval Left eval R/L 06/17/24  Hip flexion 3+ 3+ 4/4  Hip extension 3 3 4/4+  Hip abduction 4- 3+ 5/5  Hip adduction 4- 3+ 5/5  Hip internal rotation 4 3+ 5/5  Hip external rotation 4 3+ 5/5  Knee flexion 5 3+ 5/5  Knee extension 4 4 5/5  Ankle dorsiflexion     Ankle plantarflexion     Ankle inversion     Ankle eversion      (Blank rows = not tested)  LOWER EXTREMITY SPECIAL TESTS:  Did not assess  FUNCTIONAL TESTS:  Did not assess  GAIT: Distance walked: Into clinic Assistive device utilized: None Level of assistance: Complete Independence Comments: Normal reciprocal pattern                                                                                                                                TREATMENT DATE:  06/17/24 Recumbent bike L2 x 8 min Rechecked goals Marching green TB 2x10 Standing hip ext green TB 2x10 Squat 10# KB 2x10 Deadlift x10, with 10# x5 SLS 2x30 Tandem stance 2x30   06/03/24 Nustep L5 x 6 min UEs/LEs Standing gastroc stretch on slant board x 30 Standing soleus stretch on slant board x 30 Standing hamstring stretch x 30 Standing quad stretch x 30 Standing hip flexor stretch x 30 Runner's step up 4 step 2x10 Eccentric side step down 3x5 Single limb stance, 3 way hip slide on wash rag x10 Squat touch 2x10   05/27/24 Nustep L4 x 6 min UEs/LEs Side step xband walk red TB 2x10 Fwd/bwd monster walk red TB 2x10 Tandem stance toe touch with L 2x30 Standing SLR 2x10 red TB Standing  hamstring curl red TB  2x10 Standing wall squat 2x10 Standing glute set iso press down on 4 step x10 Standing fwd step up 4 step x10  05/20/24 See HEP below    PATIENT EDUCATION:  Education details: Exam findings, POC, initial HEP Person educated: Patient Education method: Explanation, Demonstration, and Handouts Education comprehension: verbalized understanding, returned demonstration, and needs further education  HOME EXERCISE PROGRAM: Access Code: [email or web address] URL: [email or web address] Date: 06/17/2024 Prepared by: [place] Perra April Earnie Starring  Exercises - Single Leg Balance with Clock Reach  - 1 x daily - 7 x weekly - 2 sets - 10 reps - Marching with Resistance  - 1 x daily - 7 x weekly - 2 sets - 10 reps - Hip Extension with Resistance Loop  - 1 x daily - 7 x weekly - 2 sets - 10 reps - Kettlebell Squat  - 1 x daily - 7 x weekly - 2 sets - 10 reps - Kettlebell Deadlift  - 1 x daily - 7 x weekly - 2 sets - 10 reps  ASSESSMENT:  CLINICAL IMPRESSION: Pt has met all of her LTGs. Increased strength noted throughout L LE. Some continued weakness in bilat hip flexors and extensors. Remains unstable with single leg balance (only able to maintain for 8 sec vs 30 sec on R LE). Updated pt's HEP accordingly with pt demonstrating a good understanding of what to work on for continued improvements at home. At this point, no more PT needs and she feels ready to d/c.   From eval: Patient is a 63 y.o. F who was seen today for physical therapy evaluation and treatment for L knee pain. PMH is significant for ACL tear (pt states she thinks she doesn't have one anymore) and currently has a L broken toe. Assessment demos L>R LE weakness - especially in her hips likely leading to decreased knee stability with steps. Pt will benefit from PT to address these deficits to maximize her level of function.   OBJECTIVE IMPAIRMENTS: Abnormal gait, decreased activity tolerance, decreased coordination, decreased endurance,  decreased mobility, decreased strength, increased fascial restrictions, increased muscle spasms, improper body mechanics, postural dysfunction, and pain.   ACTIVITY LIMITATIONS: carrying, lifting, squatting, stairs, transfers, and locomotion level  PARTICIPATION LIMITATIONS: community activity and occupation  PERSONAL FACTORS: Age, Fitness, Past/current experiences, and Time since onset of injury/illness/exacerbation are also affecting patient's functional outcome.   REHAB POTENTIAL: Good  CLINICAL DECISION MAKING: Stable/uncomplicated  EVALUATION COMPLEXITY: Low   GOALS: Goals reviewed with patient? Yes  SHORT TERM GOALS: Target date: 06/10/2024  Pt will be ind with HEP Baseline: Goal status: MET   LONG TERM GOALS: Target date: 07/01/2024   Pt will be ind with management and progression of HEP Baseline:  Goal status: MET  2.  Pt will be able to ascend/descend 10 stairs without handrail and reciprocal pattern without pain Baseline:  06/17/24: Normal reciprocal pattern no handrail Goal status: MET  3.  Pt will have improved LEFS score to >/=73/80 to demo MCID Baseline:  06/17/24: 74/80 Goal status: MET  4.  Pt will demo at least 4/5 LE strength bilat for improved stability Baseline:  06/17/24: See MMT above Goal status: MET    PLAN:  PT FREQUENCY: 1x/week  PT DURATION: 6 weeks  PLANNED INTERVENTIONS: [id number]- PT Re-evaluation, 97110-Therapeutic exercises, 97530- Therapeutic activity, 97112- Neuromuscular re-education, 97535- Self Care, [id number]- Manual therapy, [id number]- Gait training, [phone]- Electrical stimulation (unattended), 97016- Vasopneumatic device, [id number]- Ultrasound, 02966- Ionotophoresis 4mg /ml Dexamethasone, Patient/Family education, Balance training,  Stair training, Taping, Joint mobilization, Cryotherapy, and Moist heat  PLAN FOR NEXT SESSION: Assess response to HEP and update accordingly. Strengthen hips. Eccentric quad strengthening. Progress to  squats/lifting. Work on capital one.    Hiedi Touchton April Ma L Virgilia Quigg, PT, DPT 06/17/2024, 8:44 AM

## 2024-07-07 ENCOUNTER — Other Ambulatory Visit: Payer: Self-pay | Admitting: Physician Assistant

## 2024-07-07 DIAGNOSIS — Z1231 Encounter for screening mammogram for malignant neoplasm of breast: Secondary | ICD-10-CM

## 2024-08-03 ENCOUNTER — Other Ambulatory Visit: Payer: Self-pay | Admitting: Medical Genetics

## 2024-08-08 ENCOUNTER — Ambulatory Visit
Admission: RE | Admit: 2024-08-08 | Discharge: 2024-08-08 | Disposition: A | Source: Ambulatory Visit | Attending: Physician Assistant | Admitting: Physician Assistant

## 2024-08-08 DIAGNOSIS — Z1231 Encounter for screening mammogram for malignant neoplasm of breast: Secondary | ICD-10-CM

## 2024-08-31 ENCOUNTER — Other Ambulatory Visit: Payer: Self-pay | Admitting: Medical Genetics

## 2024-08-31 DIAGNOSIS — Z006 Encounter for examination for normal comparison and control in clinical research program: Secondary | ICD-10-CM

## 2024-09-21 ENCOUNTER — Institutional Professional Consult (permissible substitution) (HOSPITAL_BASED_OUTPATIENT_CLINIC_OR_DEPARTMENT_OTHER): Admitting: Internal Medicine

## 2024-11-01 ENCOUNTER — Other Ambulatory Visit
